# Patient Record
Sex: Male | Born: 1950 | Race: White | Hispanic: No | Marital: Married | Smoking: Never smoker
Health system: Southern US, Community
[De-identification: ages and names within clinical notes are randomized; demographics above are authoritative.]

## PROBLEM LIST (undated history)

## (undated) DIAGNOSIS — M1990 Unspecified osteoarthritis, unspecified site: Secondary | ICD-10-CM

## (undated) DIAGNOSIS — H409 Unspecified glaucoma: Secondary | ICD-10-CM

## (undated) DIAGNOSIS — H269 Unspecified cataract: Secondary | ICD-10-CM

## (undated) DIAGNOSIS — N189 Chronic kidney disease, unspecified: Secondary | ICD-10-CM

## (undated) HISTORY — PX: EYE SURGERY: SHX253

## (undated) HISTORY — DX: Unspecified osteoarthritis, unspecified site: M19.90

## (undated) HISTORY — PX: SPINE SURGERY: SHX786

## (undated) HISTORY — DX: Chronic kidney disease, unspecified: N18.9

## (undated) HISTORY — DX: Unspecified glaucoma: H40.9

## (undated) HISTORY — DX: Unspecified cataract: H26.9

---

## 2003-04-26 HISTORY — PX: CERVICAL DISC SURGERY: SHX588

## 2010-02-21 ENCOUNTER — Ambulatory Visit: Payer: Self-pay | Admitting: Family Medicine

## 2010-02-21 DIAGNOSIS — J189 Pneumonia, unspecified organism: Secondary | ICD-10-CM

## 2010-02-22 ENCOUNTER — Encounter: Payer: Self-pay | Admitting: Family Medicine

## 2010-02-23 ENCOUNTER — Telehealth (INDEPENDENT_AMBULATORY_CARE_PROVIDER_SITE_OTHER): Payer: Self-pay | Admitting: *Deleted

## 2010-03-04 ENCOUNTER — Ambulatory Visit: Payer: Self-pay | Admitting: Family Medicine

## 2010-03-04 DIAGNOSIS — R030 Elevated blood-pressure reading, without diagnosis of hypertension: Secondary | ICD-10-CM

## 2010-03-11 ENCOUNTER — Encounter: Payer: Self-pay | Admitting: Family Medicine

## 2010-03-12 ENCOUNTER — Encounter: Payer: Self-pay | Admitting: Family Medicine

## 2010-03-12 LAB — CONVERTED CEMR LAB
Albumin: 4.7 g/dL (ref 3.5–5.2)
Alkaline Phosphatase: 66 units/L (ref 39–117)
BUN: 20 mg/dL (ref 6–23)
Creatinine, Ser: 0.97 mg/dL (ref 0.40–1.50)
Glucose, Bld: 104 mg/dL — ABNORMAL HIGH (ref 70–99)
HDL: 42 mg/dL (ref >39)
Potassium: 5 meq/L (ref 3.5–5.3)
Total CHOL/HDL Ratio: 6.3
Triglycerides: 404 mg/dL — ABNORMAL HIGH (ref <150)

## 2010-05-25 NOTE — Progress Notes (Signed)
  Phone Note Outgoing Call Call back at [facility] Phone [phone]   Call placed by: Lajean Saver RN,  February 23, 2010 2:43 PM Call placed to: Patient Action Taken: Phone Call Completed Summary of Call: Callback: Patient reports he is improving slightly. I encouraged him to continue to take the antibiotic, a cough exptorant durign the day and a supressant at night.

## 2010-05-25 NOTE — Assessment & Plan Note (Signed)
Summary: Fever, Cough-yellowish, SOB, HA x 2 wks rm 5   Vital Signs:  Patient Profile:   60 Years Old Male CC:      Cold & URI symptoms Height:     70 inches Weight:      257 pounds O2 Sat:      96 % O2 treatment:    Room Air Temp:     100.9 degrees F oral Pulse rate:   76 / minute Pulse rhythm:   regular Resp:     14 per minute BP sitting:   142 / 76  (left arm) Cuff size:   regular  Vitals Entered By: Areta Haber CMA (February 21, 2010 4:30 PM)                  Current Allergies (reviewed today): ! TETRACYCLINE      History of Present Illness Chief Complaint: Cold & URI symptoms History of Present Illness:  Subjective: Patient complains of onset of a non-productive cough about 2 weeks ago without sore throat or sinus congestion.  Developed mild sore throat later.  Over the past 5 days he has developed recurring fever/sweats/chills and increasing fatigue.  Cough is worse at night.  He does not smoke.  He has had pneumonia in the past as a child   ? pleuritic pain No wheezing No nasal congestion No post-nasal drainage No sinus pain/pressure No itchy/red eyes No earache No hemoptysis + SOB No nausea No vomiting No abdominal pain No diarrhea No skin rashes +fatigue ? myalgias + headache Used OTC meds without relief   Current Problems: PNEUMONIA (ICD-486) FEVER UNSPECIFIED (ICD-780.60) COUGH (ICD-786.2) FAMILY HISTORY DIABETES 1ST DEGREE RELATIVE (ICD-V18.0)   Current Meds ADVIL 200 MG TABS (IBUPROFEN) as directed NYQUIL 60-7.09-21-998 MG/30ML LIQD (PSEUDOEPH-DOXYLAMINE-DM-APAP) As directed DAYQUIL MULTI-SYMPTOM 30-325-10 MG/15ML LIQD (PSEUDOEPHEDRINE-APAP-DM) as directed CLARITHROMYCIN 500 MG TABS (CLARITHROMYCIN) One Tab by mouth two times a day BENZONATATE 200 MG CAPS (BENZONATATE) One by mouth hs as needed cough  REVIEW OF SYSTEMS Constitutional Symptoms       Complains of fever and fatigue.     Denies chills, night sweats, weight loss,  and weight gain.  Eyes       Denies change in vision, eye pain, eye discharge, glasses, contact lenses, and eye surgery. Ear/Nose/Throat/Mouth       Denies hearing loss/aids, change in hearing, ear pain, ear discharge, dizziness, frequent runny nose, frequent nose bleeds, sinus problems, sore throat, hoarseness, and tooth pain or bleeding.      Comments: x 2 wks Respiratory       Complains of productive cough, wheezing, and shortness of breath.      Denies dry cough, asthma, bronchitis, and emphysema/COPD.  Cardiovascular       Denies murmurs, chest pain, and tires easily with exhertion.      Comments: chest congestion   Gastrointestinal       Denies stomach pain, nausea/vomiting, diarrhea, constipation, blood in bowel movements, and indigestion. Genitourniary       Denies painful urination, kidney stones, and loss of urinary control. Neurological       Complains of headaches.      Denies paralysis, seizures, and fainting/blackouts. Musculoskeletal       Denies muscle pain, joint pain, joint stiffness, decreased range of motion, redness, swelling, muscle weakness, and gout.  Skin       Denies bruising, unusual mles/lumps or sores, and hair/skin or nail changes.  Psych       Denies mood  changes, temper/anger issues, anxiety/stress, speech problems, depression, and sleep problems. Other Comments: Pt states he has been taking spouse's Tussionex and it has not worked.    Past History:  Past Medical History: Unremarkable  Past Surgical History: Disc Surgery  Family History: Family History Diabetes 1st degree relative Family History of Cardiovascular disorder Dad-Parkinson Disease  Social History: Married Never Smoked Alcohol use-no Drug use-no Regular exercise-no Does Patient Exercise:  no Smoking Status:  never Drug Use:  no   Objective:  No acute distress, alert and oriented  Eyes:  Pupils are equal, round, and reactive to light and accomdation.  Extraocular movement  is intact.  Conjunctivae are not inflamed.  Ears:  Canals normal.  Tympanic membranes normal.   Nose:  Normal septum.  Normal turbinates, mildly congested.    No sinus tenderness present. Pharynx:  Normal  Neck:  Supple.  No adenopathy is present.  No thyromegaly is present  Lungs:  Clear to auscultation.  Breath sounds are equal.  Heart:  Regular rate and rhythm without murmurs, rubs, or gallops.  Abdomen:  Nontender without masses or hepatosplenomegaly.  Bowel sounds are present.  No CVA or flank tenderness.  Extremities:  No edema.  Skin:  No rash CBC:  WBC 7.7; 10.3 MO, 63.7 GR, 26 LY; Hgb 13.7  CHEST - 2 VIEW   Comparison: None   Findings: The cardiomediastinal silhouette is unremarkable. Mild peribronchial thickening is noted. Patchy left lower lobe opacity may represent pneumonia. There is no evidence of pleural effusion or pneumothorax. No acute bony abnormalities are identified.   IMPRESSION: Question left lower lobe pneumonia.   Assessment New Problems: PNEUMONIA (ICD-486) FEVER UNSPECIFIED (ICD-780.60) COUGH (ICD-786.2) FAMILY HISTORY DIABETES 1ST DEGREE RELATIVE (ICD-V18.0)  PNEUMONIA; ? ATYPICAL  Plan New Medications/Changes: BENZONATATE 200 MG CAPS (BENZONATATE) One by mouth hs as needed cough  #12 x 0, 02/21/2010, Donna Christen MD CLARITHROMYCIN 500 MG TABS (CLARITHROMYCIN) One Tab by mouth two times a day  #20 x 0, 02/21/2010, Donna Christen MD  New Orders: T-Chest x-ray, 2 views [71020] CBC w/Diff [[id number]] New Patient Level IV [99204] Planning Comments:   Begin Biaxin for 10 days, expectorant daytime, cough suppressant at bedtime, increase fluid intake, rest. Follow-up with PCP in 10 days for repeat chest X-ray. Return for worsening symptoms   The patient and/or caregiver has been counseled thoroughly with regard to medications prescribed including dosage, schedule, interactions, rationale for use, and possible side effects and they verbalize  understanding.  Diagnoses and expected course of recovery discussed and will return if not improved as expected or if the condition worsens. Patient and/or caregiver verbalized understanding.  Prescriptions: BENZONATATE 200 MG CAPS (BENZONATATE) One by mouth hs as needed cough  #12 x 0   Entered and Authorized by:   Donna Christen MD   Signed by:   Donna Christen MD on 02/21/2010   Method used:   Print then Give to Patient   RxID:   [id number] CLARITHROMYCIN 500 MG TABS (CLARITHROMYCIN) One Tab by mouth two times a day  #20 x 0   Entered and Authorized by:   Donna Christen MD   Signed by:   Donna Christen MD on 02/21/2010   Method used:   Print then Give to Patient   RxID:   [id number]   Patient Instructions: 1)  Take Mucinex  (guaifenesin) twice daily for congestion. 2)  Increase fluid intake, rest. 3)  Stop other OTC cough medications. 4)  Follow-up with family doctor in about 10  days for repeat chest X-ray.  Orders Added: 1)  T-Chest x-ray, 2 views [71020] 2)  CBC w/Diff [[id number]] 3)  New Patient Level IV [52841]

## 2010-05-25 NOTE — Assessment & Plan Note (Signed)
Summary: NOV: F/U PNA   Vital Signs:  Patient profile:   60 year old male Height:      70 inches Weight:      248 pounds Pulse rate:   71 / minute BP sitting:   149 / 79  (right arm) Cuff size:   regular  Vitals Entered By: Avon Gully CMA, (AAMA) (March 04, 2010 1:15 PM) CC: NP est care-f/u pneumonia   CC:  NP est care-f/u pneumonia.  History of Present Illness: Dx with PNA on 02-21-10 by CXR. Given Biaxin. Completed ABX yesterday. 2 prior bouts of PNA as a child.  Still slight cough but overall feels tremendously better. Has not had a flu vaccine.   Habits & Providers  Alcohol-Tobacco-Diet     Alcohol drinks/day: <1  Exercise-Depression-Behavior     STD Risk: never     Drug Use: never     Seat Belt Use: always  Allergies: 1)  ! Tetracycline  Past History:  Past Medical History: Last updated: 02/21/2010 Unremarkable  Past Surgical History: Cervical Disc Surgery 2005  Family History: Family History Diabetes 1st degree relative Family History of Cardiovascular disorder Dad-Parkinson Disease, deceased.   Mother with CAD.    Social History: Retired Medical illustrator. 2 yrs of college. Married to [place] with 4 kids.  Married Never Smoked Alcohol use-no Drug use-no Regular exercise-no STD Risk:  never Drug Use:  never Risk analyst Use:  always  Review of Systems       No fever/sweats/weakness, unexplained weight loss/gain.  No vison changes.  No difficulty hearing/ringing in ears, hay fever/allergies.  No chest pain/discomfort, palpitations.  No Br lump/nipple discharge.  + cough/wheeze.  No blood in BM, nausea/vomiting/diarrhea.  No nighttime urination, leaking urine, unusual vaginal bleeding, discharge (penis or vagina).  No muscle/joint pain. No rash, change in mole.  No HA, memory loss.  No anxiety, sleep d/o, depression.  No easy bruising/bleeding, unexplained lump   Physical Exam  General:  Well-developed,well-nourished,in no acute distress;  alert,appropriate and cooperative throughout examination Head:  Normocephalic and atraumatic without obvious abnormalities. No apparent alopecia or balding. Lungs:  Normal respiratory effort, chest expands symmetrically. Lungs are clear to auscultation, no crackles or wheezes. Heart:  Normal rate and regular rhythm. S1 and S2 normal without gallop, murmur, click, rub or other extra sounds. Skin:  no rashes.   Cervical Nodes:  No lymphadenopathy noted Psych:  Cognition and judgment appear intact. Alert and cooperative with normal attention span and concentration. No apparent delusions, illusions, hallucinations   Impression & Recommendations:  Problem # 1:  PNEUMONIA (ICD-486) Exam is normla today. He is doing well.  Given flu vaccine.  The following medications were removed from the medication list:    Clarithromycin 500 Mg Tabs (Clarithromycin) ..... One tab by mouth two times a day  Problem # 2:  ELEVATED BP READING WITHOUT DX HYPERTENSION (ICD-796.2) He says he is working on his weight to improve this. has alsoready lost 7 lbs.  Will recheck at his f/u and if still elevated will need to tx. Rule out thyroid d/o.  Orders: T-TSH [phone])  Other Orders: T-Comprehensive Metabolic Panel [phone]) T-Lipid Profile [phone]) Flu Vaccine 70yrs + (52841) Admin 1st Vaccine (32440)   Patient Instructions: 1)  Follow up in the next month for a physical and so we can recheck your blood pressure.  2)  You can go to the lab anytime Mon-Fri. Make sure fast for 8 hours.    Orders Added: 1)  T-Comprehensive Metabolic  Panel [[id number]] 2)  T-Lipid Profile [[id number]] 3)  T-TSH [[id number]] 4)  New Patient Level III [99203] 5)  Flu Vaccine 42yrs + [90658] 6)  Admin 1st Vaccine [90471]   Immunizations Administered:  Influenza Vaccine # 1:    Vaccine Type: Fluvax 3+    Site: left deltoid    Mfr: GlaxoSmithKline    Dose: 0.5 ml    Route: IM    Given by: Sue Lush McCrimmon  CMA, (AAMA)    Exp. Date: 10/23/2010    Lot #: [id number]    VIS given: 11/17/09 version given March 04, 2010.  Flu Vaccine Consent Questions:    Do you have a history of severe allergic reactions to this vaccine? no    Any prior history of allergic reactions to egg and/or gelatin? no    Do you have a sensitivity to the preservative Thimersol? no    Do you have a past history of Guillan-Barre Syndrome? no    Do you currently have an acute febrile illness? no    Have you ever had a severe reaction to latex? no    Vaccine information given and explained to patient? no   Immunizations Administered:  Influenza Vaccine # 1:    Vaccine Type: Fluvax 3+    Site: left deltoid    Mfr: GlaxoSmithKline    Dose: 0.5 ml    Route: IM    Given by: Sue Lush McCrimmon CMA, (AAMA)    Exp. Date: 10/23/2010    Lot #: [id number]    VIS given: 11/17/09 version given March 04, 2010.  Flex Sig Next Due:  Not Indicated Colonoscopy Result Date:  09/23/2008 Colonoscopy Result:  normal Hemoccult Next Due:  Not Indicated

## 2010-12-31 ENCOUNTER — Telehealth: Payer: Self-pay | Admitting: *Deleted

## 2011-02-07 ENCOUNTER — Telehealth: Payer: Self-pay | Admitting: Family Medicine

## 2011-02-07 NOTE — Telephone Encounter (Signed)
Patient called left voicemail request a call back about prescription refill and that he needs a letter in order for him to get a refill. Patient request a call back

## 2011-02-09 NOTE — Telephone Encounter (Signed)
The vm is for Arlyn Leak the patient's mother

## 2011-02-24 ENCOUNTER — Encounter: Payer: Self-pay | Admitting: Family Medicine

## 2011-03-02 ENCOUNTER — Ambulatory Visit (INDEPENDENT_AMBULATORY_CARE_PROVIDER_SITE_OTHER): Payer: 59 | Admitting: Family Medicine

## 2011-03-02 ENCOUNTER — Encounter: Payer: Self-pay | Admitting: Family Medicine

## 2011-03-02 VITALS — BP 133/81 | HR 72 | Ht 70.0 in | Wt 251.0 lb

## 2011-03-02 DIAGNOSIS — N400 Enlarged prostate without lower urinary tract symptoms: Secondary | ICD-10-CM | POA: Insufficient documentation

## 2011-03-02 DIAGNOSIS — Z0000 Encounter for general adult medical examination without abnormal findings: Secondary | ICD-10-CM

## 2011-03-02 DIAGNOSIS — Z23 Encounter for immunization: Secondary | ICD-10-CM

## 2011-03-02 NOTE — Patient Instructions (Signed)
Start a regular exercise program and make sure you are eating a healthy diet Try to eat 4 servings of dairy a day or take a calcium supplement (500mg  twice a day). We will call you with your lab results. If you don't here from [place] in about a week then please give [place] a call at [phone].

## 2011-03-02 NOTE — Progress Notes (Signed)
Subjective:    Patient ID: Steven Ashley, male    DOB: 08-18-1950, 60 y.o.   MRN: [id number]  HPI Here for CPE. No complaints. No regular exercise. Urinating 1-2 at night.  Dec force of stream. He says he usually takes saw palmetto and this seems to help. He also notes that when he eats and drinks more coffee and avoids tea and coffee products that his urinary symptoms actually improved. He also notices that when his weight is under better control it his urinary symptoms improved as well. His father has a history of BPH that required surgery. No history or family history of prostate cancer.   Review of Systems     BP 133/81  Pulse 72  Wt 251 lb (113.853 kg)    Allergies  Allergen Reactions  . Tetracycline     REACTION: Rash    History reviewed. No pertinent past medical history.  Past Surgical History  Procedure Date  . Cervical disc surgery 2005    History   Social History  . Marital Status: Married    Spouse Name: N/A    Number of Children: 4  . Years of Education: N/A   Occupational History  . Not on file.   Social History Main Topics  . Smoking status: Never Smoker   . Smokeless tobacco: Not on file  . Alcohol Use: Yes  . Drug Use: No  . 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   Other Topics Concern  . Not on file   Social History Narrative   Some regular exercise.      Family History  Problem Relation Age of Onset  . Diabetes    . Heart disease Mother   . Parkinsonism Father     deceased  . Coronary artery disease Mother     smoker  . Dementia Mother   . Diabetic kidney disease Mother     No current outpatient prescriptions on file.    Objective:   Physical Exam  Constitutional: He is oriented to person, place, and time. He appears well-developed and well-nourished.  HENT:  Head: Normocephalic and atraumatic.  Right Ear: External ear normal.  Left Ear: External ear normal.  Nose: Nose normal.   Mouth/Throat: Oropharynx is clear and moist.  Eyes: Conjunctivae and EOM are normal. Pupils are equal, round, and reactive to light.  Neck: Normal range of motion. Neck supple. No thyromegaly present.  Cardiovascular: Normal rate, regular rhythm, normal heart sounds and intact distal pulses.        No carotid or abdominal bruits.  Pulmonary/Chest: Effort normal and breath sounds normal.  Abdominal: Soft. Bowel sounds are normal. He exhibits no distension and no mass. There is no tenderness. There is no rebound and no guarding.  Genitourinary: Rectum normal.       Prostate was 2+ hypertrophy. No nodules or bogginess.  Musculoskeletal: Normal range of motion.  Lymphadenopathy:    He has no cervical adenopathy.  Neurological: He is alert and oriented to person, place, and time. He has normal reflexes.  Skin: Skin is warm and dry.  Psychiatric: He has a normal mood and affect. His behavior is normal. Judgment and thought content normal.          Assessment & Plan:  CPE- overall he is doing well. We did discuss the importance of weight loss. He says he plans to work on exercise and diet. His blood pressure is  under control today. I gave him a lab slip for screening lab work. We will call him with results. We'll also check a PSA.   He was to check with his insurance first to see if they cover the shingles vaccine before getting it. He did get his flu shot today.  BPH-his AUA score is 8 today, which is moderate. We discussed that we could consider starting a medication to help with his BPH or if the saw palmetto seems to work in addition to dietary changes and losing weight then we could opt to that as well. He says he would like to work on his weight etc. hold off on any medications. I did let him know that it seems to get worse to feel free to call the office to schedule an appointment.

## 2011-03-09 LAB — COMPLETE METABOLIC PANEL WITH GFR
AST: 24 U/L (ref 0–37)
Alkaline Phosphatase: 55 U/L (ref 39–117)
BUN: 20 mg/dL (ref 6–23)
Creat: 0.96 mg/dL (ref 0.50–1.35)
Potassium: 4.7 mEq/L (ref 3.5–5.3)

## 2011-03-09 LAB — LIPID PANEL
Cholesterol: 308 mg/dL — ABNORMAL HIGH (ref 0–200)
LDL Cholesterol: 186 mg/dL — ABNORMAL HIGH (ref 0–99)
Total CHOL/HDL Ratio: 7.2 Ratio
Triglycerides: 394 mg/dL — ABNORMAL HIGH (ref <150)
VLDL: 79 mg/dL — ABNORMAL HIGH (ref 0–40)

## 2011-03-09 LAB — PSA: PSA: 0.8 ng/mL (ref <=4.00)

## 2011-03-10 ENCOUNTER — Other Ambulatory Visit: Payer: Self-pay | Admitting: Family Medicine

## 2011-03-10 MED ORDER — SIMVASTATIN 40 MG PO TABS: 40.0000 mg | ORAL_TABLET | Freq: Every day | ORAL | Status: DC

## 2011-03-21 ENCOUNTER — Encounter: Payer: Self-pay | Admitting: *Deleted

## 2011-03-21 ENCOUNTER — Emergency Department (INDEPENDENT_AMBULATORY_CARE_PROVIDER_SITE_OTHER)
Admission: EM | Admit: 2011-03-21 | Discharge: 2011-03-21 | Disposition: A | Discharge status: Home / Self Care | Payer: 59 | Source: Home / Self Care | Attending: Emergency Medicine | Admitting: Emergency Medicine

## 2011-03-21 DIAGNOSIS — S51809A Unspecified open wound of unspecified forearm, initial encounter: Secondary | ICD-10-CM

## 2011-03-21 DIAGNOSIS — S40259A Superficial foreign body of unspecified shoulder, initial encounter: Secondary | ICD-10-CM

## 2011-03-21 MED ORDER — CEPHALEXIN 500 MG PO CAPS: 500.0000 mg | ORAL_CAPSULE | Freq: Two times a day (BID) | ORAL | Status: AC

## 2011-03-21 NOTE — ED Notes (Addendum)
Patient fell through a barn roof today. He had a piece of wood that splintered his upper right arm. He was able to pull our most of the wood but still has a few pieces left. He has a shallow laceration about 3 inches long. Bleeding has stopped. Last TDaP he beleives was 3 years ago. No other injures noted.

## 2011-03-21 NOTE — ED Provider Notes (Signed)
History     CSN: [id number] Arrival date & time: 03/21/2011  1:06 PM   First MD Initiated Contact with Patient 03/21/11 1345      Chief Complaint  Patient presents with  . 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  When he got up he noticed that he had cut his right arm. He pulled a small splinter out of his arm but he thinks that there are residual pieces still left in there. There was a little bit of bleeding but that stopped with pressure. He also has slight tenderness as well. He states that he got a recent tetanus shot about 3 years ago.  History reviewed. No pertinent past medical history.  Past Surgical History  Procedure Date  . Cervical disc surgery 2005    Family History  Problem Relation Age of Onset  . Diabetes    . Heart disease Mother   . Parkinsonism Father     deceased  . Coronary artery disease Mother     smoker  . Dementia Mother   . Diabetic kidney disease Mother     History  Substance Use Topics  . Smoking status: Never Smoker   . Smokeless tobacco: Not on file  . Alcohol Use: Yes      Review of Systems  Allergies  Tetracycline  Home Medications   Current Outpatient Rx  Name Route Sig Dispense Refill  . SIMVASTATIN 40 MG PO TABS Oral Take 1 tablet (40 mg total) by mouth at bedtime. 90 tablet 0    BP 127/81  Pulse 83  Temp(Src) 98.8 F (37.1 C) (Oral)  Resp 14  Ht 5\' 10"  (1.778 m)  Wt 253 lb (114.76 kg)  BMI 36.30 kg/m2  SpO2 96%  Physical Exam  Nursing note and vitals reviewed. Constitutional: He is oriented to person, place, and time. He appears well-developed and well-nourished.  HENT:  Head: Normocephalic and atraumatic.  Neck: Neck supple.  Cardiovascular: Regular rhythm and normal heart sounds.   Pulmonary/Chest: Effort normal and  breath sounds normal. No respiratory distress.  Neurological: He is alert and oriented to person, place, and time.  Skin: Skin is warm and dry.       Right upper arm: There is a 6 cm very superficial scratch on the arm and has minimal bleeding. The bleeding to stop pressure. There are scattered dark specks throughout the scratch. There is no signs of current infection. Minimal tenderness. Distal neurovascular status is intact.   Psychiatric: He has a normal mood and affect. His speech is normal.    ED Course  Procedures (including critical care time)  Labs Reviewed - No data to display No results found.   No diagnosis found.    MDM   The area was cleaned with Hibiclens soaked with LET solution.  We then explored the wound and removed multiple very small splinters that were only a few millimeters long we do not need to incise the wound at all or use any injections. The patient tolerated this procedure very well. We then covered with antibiotic ointment, and gave him a prescription for Keflex to prevent any infection, and he will take care of this at home. I feel this is probably most appropriate if he does some very light scrubbing to remove any kind of residual pieces that are still left  over. However I do not feel that he will be having an infection or any type residual symptoms for this. If she does develop any type of further infection, I would like him to followup either here or with his primary care doctor so that we can look at the wound. If there is further problems, it may be reasonable you to get an x-ray or an ultrasound to see if there is any residual fragments still left in place. If that is the case that he may have had a general surgeon to look at that cut into a scan and see if they can retrieve it that way.    Lily Kocher, MD 03/21/11 [phone]

## 2012-07-26 ENCOUNTER — Ambulatory Visit (INDEPENDENT_AMBULATORY_CARE_PROVIDER_SITE_OTHER): Payer: 59 | Admitting: Family Medicine

## 2012-07-26 ENCOUNTER — Encounter: Payer: Self-pay | Admitting: Family Medicine

## 2012-07-26 ENCOUNTER — Other Ambulatory Visit: Payer: Self-pay | Admitting: *Deleted

## 2012-07-26 VITALS — BP 140/74 | HR 70 | Ht 69.0 in | Wt 244.0 lb

## 2012-07-26 DIAGNOSIS — J309 Allergic rhinitis, unspecified: Secondary | ICD-10-CM

## 2012-07-26 DIAGNOSIS — R040 Epistaxis: Secondary | ICD-10-CM

## 2012-07-26 MED ORDER — MUPIROCIN 2 % EX OINT: TOPICAL_OINTMENT | Freq: Two times a day (BID) | CUTANEOUS | Status: DC

## 2012-07-26 NOTE — Patient Instructions (Addendum)
Claritin Daily.   Ointment apply twice a day Run humidifier at bedtime.   Call if not better in  One week.

## 2012-07-26 NOTE — Progress Notes (Signed)
  Subjective:    Patient ID: Steven Ashley, male    DOB: 1950-05-12, 62 y.o.   MRN: [id number]  HPI Did some mowing 2 days ago and then started getting a scratchy throat and then thought maybe a cold. Then had a severe HA around noon yesterday. Pain across the nose and sinuses.  + post nasal drip.  No fever. No cough.  Says couldn't lay flat bc of HA. No GI sxs.  Throat is about hte seem. Has been having nose bleeds recently.     Review of Systems     Objective:   Physical Exam  Constitutional: He is oriented to person, place, and time. He appears well-developed and well-nourished.  HENT:  Head: Normocephalic and atraumatic.  Right Ear: External ear normal.  Left Ear: External ear normal.  Nose: Nose normal.  Mouth/Throat: Oropharynx is clear and moist.  TMs and canals are clear.   Eyes: Conjunctivae and EOM are normal. Pupils are equal, round, and reactive to light.  Neck: Neck supple. No thyromegaly present.  Cardiovascular: Normal rate and normal heart sounds.   Pulmonary/Chest: Effort normal and breath sounds normal.  Musculoskeletal: He exhibits no edema.  Lymphadenopathy:    He has no cervical adenopathy.  Neurological: He is alert and oriented to person, place, and time.  Skin: Skin is warm and dry.  Psychiatric: He has a normal mood and affect.          Assessment & Plan:  Allergic rhinitis - will tx w/ claritin. Call if not getting better or if suddenly gets worse.  Can use Tylenol for HA relief.  Possible early sinus infection/viral.   Nosebleeds - treat with bactroban oint BID and recommend running a humidifier at bedtime. Avoid blowing the nose frequently. Avoid picking at the nose. Avoid rubbing the nose aggressively.

## 2012-07-30 ENCOUNTER — Ambulatory Visit (INDEPENDENT_AMBULATORY_CARE_PROVIDER_SITE_OTHER): Payer: 59 | Admitting: Family Medicine

## 2012-07-30 ENCOUNTER — Other Ambulatory Visit: Payer: Self-pay | Admitting: Family Medicine

## 2012-07-30 ENCOUNTER — Encounter: Payer: Self-pay | Admitting: Family Medicine

## 2012-07-30 VITALS — BP 121/76 | HR 62 | Wt 248.0 lb

## 2012-07-30 DIAGNOSIS — N529 Male erectile dysfunction, unspecified: Secondary | ICD-10-CM | POA: Insufficient documentation

## 2012-07-30 DIAGNOSIS — N400 Enlarged prostate without lower urinary tract symptoms: Secondary | ICD-10-CM

## 2012-07-30 DIAGNOSIS — R5381 Other malaise: Secondary | ICD-10-CM

## 2012-07-30 DIAGNOSIS — R5383 Other fatigue: Secondary | ICD-10-CM

## 2012-07-30 DIAGNOSIS — R040 Epistaxis: Secondary | ICD-10-CM

## 2012-07-30 LAB — CBC WITH DIFFERENTIAL/PLATELET
Eosinophils Absolute: 0.3 10*3/uL (ref 0.0–0.7)
Eosinophils Relative: 4 % (ref 0–5)
Hemoglobin: 14.6 g/dL (ref 13.0–17.0)
Lymphs Abs: 2 10*3/uL (ref 0.7–4.0)
MCH: 28.9 pg (ref 26.0–34.0)
MCHC: 33.4 g/dL (ref 30.0–36.0)
MCV: 86.4 fL (ref 78.0–100.0)
Monocytes Relative: 8 % (ref 3–12)
RBC: 5.06 MIL/uL (ref 4.22–5.81)

## 2012-07-30 MED ORDER — TADALAFIL 5 MG PO TABS: 5.0000 mg | ORAL_TABLET | Freq: Every day | ORAL | Status: DC | PRN

## 2012-07-30 MED ORDER — SILDENAFIL CITRATE 100 MG PO TABS: 100.0000 mg | ORAL_TABLET | Freq: Every day | ORAL | Status: DC | PRN

## 2012-07-30 NOTE — Progress Notes (Signed)
  Subjective:    Patient ID: Steven Ashley, male    DOB: 15-Feb-1951, 62 y.o.   MRN: [id number]  HPI Nosebleeds - They are much better overall.Using the bactroban ointments.   AR - Says sinuses are getting better. Still has green nasal congestion.  No fever.  Lost voice yesterday but acutally feels better today.   He started his allergy medication.   ED- Started about 4-6 months ago. Retired in November and started around that time.  Not on any medications.  He is on a diet to see if weight loss. No libido issues. He has difficulty with erection and penetration at least 50% of the time. He does report feeling more fatigued. No new medications or supplements. He does take saw palmetto for his prostate. He also takes a [employer] multivitamin. Father had prostate surgery in his mid-50s to 29s.  Has felt more tired.  Sleeps well. Has never tried any ED medications. He denies any new sexual partners or penile discharge.   Review of Systems     Objective:   Physical Exam  Constitutional: He is oriented to person, place, and time. He appears well-developed and well-nourished.  HENT:  Head: Normocephalic and atraumatic.  Cardiovascular: Normal rate, regular rhythm and normal heart sounds.   Pulmonary/Chest: Effort normal and breath sounds normal.  Neurological: He is alert and oriented to person, place, and time.  Skin: Skin is warm and dry.  Psychiatric: He has a normal mood and affect. His behavior is normal.          Assessment & Plan:  Nosebleeds - REsolving with Bactroban ointment. Also encouraged using a humidifier..    AR - Getting some better with treatment for allergies, call if suddenly worse and we can call in abx for sinusitis.   ED - Explained need to work on weight loss, exercise and healthy diet. I explained how weight loss can reduce estrogen and actually promote testosterone levels. Will check testosterone level since she did screen positive on the questionnaire. He answered yes  to 6/10 questions. His ED score was 13 which is significant. We also discussed the use of as needed erectile dysfunction medications such as Viagra and Cialis and Levitra. Coupon card given for 3 free tablets of Viagra. Prescription written for 100 mg tabs. Encouraged him to cut them in half and try half a tab first. He does have a history of BPH so we also discussed the option of using low-dose Cialis daily to help control his BPH and help with erectile dysfunction. Given 30 day free coupon card for 5 mg dose. He is not to take the Viagra and Cialis at the same time and he is aware of this. Will check CBC, TSH, and testosterone levels  BPH-his AUA score was 15 today, moderate.  Time spent 25 minutes, greater than 50% of time spent counseling about erectile dysfunction and treatment options.

## 2012-07-31 LAB — HEPATIC FUNCTION PANEL
ALT: 52 U/L (ref 0–53)
AST: 64 U/L — ABNORMAL HIGH (ref 0–37)
Albumin: 4.5 g/dL (ref 3.5–5.2)
Alkaline Phosphatase: 60 U/L (ref 39–117)
Total Protein: 6.9 g/dL (ref 6.0–8.3)

## 2012-07-31 LAB — HEPATITIS PANEL, ACUTE: Hepatitis B Surface Ag: NEGATIVE

## 2012-07-31 LAB — TESTOSTERONE, FREE, TOTAL, SHBG
Sex Hormone Binding: 27 nmol/L (ref 13–71)
Testosterone, Free: 41.6 pg/mL — ABNORMAL LOW (ref 47.0–244.0)
Testosterone-% Free: 2.1 % (ref 1.6–2.9)
Testosterone: 196 ng/dL — ABNORMAL LOW (ref 300–890)

## 2012-07-31 NOTE — Addendum Note (Signed)
Addended by: Deno Etienne on: 07/31/2012 11:54 AM   Modules accepted: Orders

## 2012-08-01 ENCOUNTER — Other Ambulatory Visit: Payer: Self-pay | Admitting: Family Medicine

## 2012-08-01 DIAGNOSIS — R7989 Other specified abnormal findings of blood chemistry: Secondary | ICD-10-CM

## 2012-08-01 DIAGNOSIS — R748 Abnormal levels of other serum enzymes: Secondary | ICD-10-CM

## 2012-08-06 ENCOUNTER — Ambulatory Visit (INDEPENDENT_AMBULATORY_CARE_PROVIDER_SITE_OTHER): Payer: 59 | Admitting: Physician Assistant

## 2012-08-06 ENCOUNTER — Encounter: Payer: Self-pay | Admitting: Physician Assistant

## 2012-08-06 VITALS — BP 133/79 | HR 75 | Wt 241.0 lb

## 2012-08-06 DIAGNOSIS — W57XXXA Bitten or stung by nonvenomous insect and other nonvenomous arthropods, initial encounter: Secondary | ICD-10-CM

## 2012-08-06 DIAGNOSIS — L03319 Cellulitis of trunk, unspecified: Secondary | ICD-10-CM

## 2012-08-06 DIAGNOSIS — L0390 Cellulitis, unspecified: Secondary | ICD-10-CM

## 2012-08-06 DIAGNOSIS — S30860A Insect bite (nonvenomous) of lower back and pelvis, initial encounter: Secondary | ICD-10-CM

## 2012-08-06 MED ORDER — AMOXICILLIN-POT CLAVULANATE 500-125 MG PO TABS: 1.0000 | ORAL_TABLET | Freq: Three times a day (TID) | ORAL | Status: DC

## 2012-08-06 NOTE — Progress Notes (Signed)
  Subjective:    Patient ID: Steven Ashley, male    DOB: 22-Jun-1950, 62 y.o.   MRN: [id number]  HPI Patient presents to the clinic with a tick bite from 8 days ago on her back. 5 days after tick bite he noticed a red rash around bite and increasing in size to about a dime. It continues to get bigger and redder. His wife wanted him to go to the doctor. Denies any fever, chills, muscle aches, pains, sweats.  brougth the tick in today.     Review of Systems     Objective:   Physical Exam  Constitutional: He appears well-developed and well-nourished.  Cardiovascular: Normal rate, regular rhythm and normal heart sounds.   Pulmonary/Chest: Effort normal and breath sounds normal.  Skin:             Assessment & Plan:  Tick bite- Tick appears to be a lone star tick(brown with a white spot on back) they do not tend to carry lymes disease. Rash is also not presenting in typical lymes fashion. Due to surround cellulitis and potential for lymes will treat. Pt has an allergy to tetracyclines so I did not choose doxycycline but sent augmentin. Call if not improving or if any systemic symptoms. Will check for antibodies to lymes.

## 2012-10-29 ENCOUNTER — Telehealth: Payer: Self-pay | Admitting: *Deleted

## 2012-10-29 NOTE — Telephone Encounter (Signed)
onlythe Cialis is FDA approved to help BPH. He has to be on it for at least a week to know if it's helping with the erectile dysfunction part. Because it is low dose it works a little bit differently. For his erectile dysfunction if he would prefer to use Viagra and did not worry about the enlarged prostate at this time then we can use the Viagra. We can always use a different prescription medication for the enlarged prostate with the Viagra for erectile dysfunction.

## 2012-10-29 NOTE — Telephone Encounter (Signed)
Pt calls and states you gave him samples of Cialis and Viagra and he tried the Cialis and it really didn't work for him.  Taking the Viagra 1/3 tablet helps him the most and sometimes doesn't have to take another one for like 3 days.  I told him it looked like you told him the Cialis would help with the BPH that he has a history of and that I would ask you which one you prefer and he would do what you said but needs a refill on which ever one you decide. Barry Dienes, LPN

## 2012-10-29 NOTE — Telephone Encounter (Signed)
Pt notified of MD instructions and he wants to check with his insurance on cost of each drug and do research on life style changes for BPH and he will let us know at that time. Barry Dienes, LPN

## 2012-10-30 MED ORDER — TADALAFIL 5 MG PO TABS: 5.0000 mg | ORAL_TABLET | Freq: Every day | ORAL | Status: DC

## 2012-10-30 NOTE — Telephone Encounter (Signed)
Refill sent.

## 2012-10-30 NOTE — Telephone Encounter (Signed)
Pt called & states that he would like to go through with cialis.

## 2012-11-03 ENCOUNTER — Other Ambulatory Visit: Payer: Self-pay | Admitting: Family Medicine

## 2012-11-08 ENCOUNTER — Other Ambulatory Visit: Payer: Self-pay | Admitting: Family Medicine

## 2013-08-23 ENCOUNTER — Other Ambulatory Visit: Payer: Self-pay | Admitting: Orthopedic Surgery

## 2013-08-23 DIAGNOSIS — M1990 Unspecified osteoarthritis, unspecified site: Secondary | ICD-10-CM

## 2013-08-28 ENCOUNTER — Ambulatory Visit
Admission: RE | Admit: 2013-08-28 | Discharge: 2013-08-28 | Disposition: A | Discharge status: Home / Self Care | Payer: 59 | Source: Ambulatory Visit | Attending: Orthopedic Surgery | Admitting: Orthopedic Surgery

## 2013-08-28 ENCOUNTER — Inpatient Hospital Stay: Admission: RE | Admit: 2013-08-28 | Payer: 59 | Source: Ambulatory Visit

## 2013-08-28 DIAGNOSIS — M1990 Unspecified osteoarthritis, unspecified site: Secondary | ICD-10-CM

## 2015-01-29 ENCOUNTER — Encounter: Payer: Self-pay | Admitting: Family Medicine

## 2015-01-29 ENCOUNTER — Ambulatory Visit (INDEPENDENT_AMBULATORY_CARE_PROVIDER_SITE_OTHER): Payer: 59

## 2015-01-29 ENCOUNTER — Ambulatory Visit (INDEPENDENT_AMBULATORY_CARE_PROVIDER_SITE_OTHER): Payer: 59 | Admitting: Family Medicine

## 2015-01-29 VITALS — BP 138/78 | HR 83 | Wt 237.0 lb

## 2015-01-29 DIAGNOSIS — S92302A Fracture of unspecified metatarsal bone(s), left foot, initial encounter for closed fracture: Secondary | ICD-10-CM | POA: Diagnosis not present

## 2015-01-29 DIAGNOSIS — X58XXXD Exposure to other specified factors, subsequent encounter: Secondary | ICD-10-CM | POA: Diagnosis not present

## 2015-01-29 DIAGNOSIS — S99912A Unspecified injury of left ankle, initial encounter: Secondary | ICD-10-CM | POA: Diagnosis not present

## 2015-01-29 DIAGNOSIS — S92352A Displaced fracture of fifth metatarsal bone, left foot, initial encounter for closed fracture: Secondary | ICD-10-CM | POA: Diagnosis not present

## 2015-01-29 DIAGNOSIS — S92353A Displaced fracture of fifth metatarsal bone, unspecified foot, initial encounter for closed fracture: Secondary | ICD-10-CM | POA: Insufficient documentation

## 2015-01-29 NOTE — Progress Notes (Signed)
Steven Ashley is a 64 y.o. male who presents to [facility]  today for left ankle fracture. Patient suffered an inversion injury to his left ankle and foot and now notes pain in the lateral ankle and foot. He denies any radiating pain weakness or numbness fevers or chills. No treatments tried yet. He is using crutches which seems to help some.   No past medical history on file. Past Surgical History  Procedure Laterality Date  . Cervical disc surgery  2005   Social History  Substance Use Topics  . Smoking status: Never Smoker   . Smokeless tobacco: Not on file  . Alcohol Use: Yes   family history includes Benign prostatic hyperplasia in his father; Coronary artery disease in his mother; Dementia in his mother; Diabetes in an other family member; Diabetic kidney disease in his mother; Heart disease in his mother; Parkinsonism in his father.  ROS as above Medications: Current Outpatient Prescriptions  Medication Sig Dispense Refill  . Multiple Vitamin (MULTIVITAMIN WITH MINERALS) TABS Take 1 tablet by mouth daily. [place]    . saw palmetto 160 MG capsule Take 160 mg by mouth daily.    . sildenafil (VIAGRA) 100 MG tablet Take 1 tablet (100 mg total) by mouth daily as needed for erectile dysfunction. 3 tablet 0   No current facility-administered medications for this visit.   Allergies  Allergen Reactions  . Tetracycline     REACTION: Rash     Exam:  BP 138/78 mmHg  Pulse 83  Wt 237 lb (107.502 kg) Gen: Well NAD HEENT: EOMI,  MMM Lungs: Normal work of breathing. CTABL Heart: RRR no MRG Abd: NABS, Soft. Nondistended, Nontender Exts: Brisk capillary refill, warm and well perfused.  Left foot swollen and tender fifth metatarsal. Ankle is mildly tender without significant swelling. Motion not tested. Pulses capillary refill sensation intact.  Preliminary x-ray of the foot and foot shows a avulsion type fracture of the fifth metatarsal that  appears to be comminuted. Additionally there is a secondary fracture of another structure that's difficult to ascertain. Suspect calcaneus involvement. Awaiting formal radiology  No results found for this or any previous visit (from the past 24 hour(s)). No results found.    64 year old man with left fifth metatarsal fracture. Posterior leg splint fitted. Patient will return in 1 week for recheck and reevaluation. NSAIDs for pain control.

## 2015-01-29 NOTE — Patient Instructions (Signed)
Thank you for coming in today. Return in 1 week for recheck and cast.   Metatarsal Fracture A metatarsal fracture is a break in a metatarsal bone. Metatarsal bones connect your toe bones to your ankle bones. CAUSES This type of fracture may be caused by:  A sudden twisting of your foot.  A fall onto your foot.  Overuse or repetitive exercise. RISK FACTORS This condition is more likely to develop in people who:  Play contact sports.  Have a bone disease.  Have a low calcium level. SYMPTOMS Symptoms of this condition include:  Pain that is worse when walking or standing.  Pain when pressing on the foot or moving the toes.  Swelling.  Bruising on the top or bottom of the foot.  A foot that appears shorter than the other one. DIAGNOSIS This condition is diagnosed with a physical exam. You may also have imaging tests, such as:  X-rays.  A CT scan.  MRI. TREATMENT Treatment for this condition depends on its severity and whether a bone has moved out of place. Treatment may involve:  Rest.  Wearing foot support such as a cast, splint, or boot for several weeks.  Using crutches.  Surgery to move bones back into the right position. Surgery is usually needed if there are many pieces of broken bone or bones that are very out of place (displaced fracture).  Physical therapy. This may be needed to help you regain full movement and strength in your foot. You will need to return to your health care provider to have X-rays taken until your bones heal. Your health care provider will look at the X-rays to make sure that your foot is healing well. HOME CARE INSTRUCTIONS  If You Have a Cast:  Do not stick anything inside the cast to scratch your skin. Doing that increases your risk of infection.  Check the skin around the cast every day. Report any concerns to your health care provider. You may put lotion on dry skin around the edges of the cast. Do not apply lotion to the skin  underneath the cast.  Keep the cast clean and dry. If You Have a Splint or a Supportive Boot:  Wear it as directed by your health care provider. Remove it only as directed by your health care provider.  Loosen it if your toes become numb and tingle, or if they turn cold and blue.  Keep it clean and dry. Bathing  Do not take baths, swim, or use a hot tub until your health care provider approves. Ask your health care provider if you can take showers. You may only be allowed to take sponge baths for bathing.  If your health care provider approves bathing and showering, cover the cast or splint with a watertight plastic bag to protect it from water. Do not let the cast or splint get wet. Managing Pain, Stiffness, and Swelling  If directed, apply ice to the injured area (if you have a splint, not a cast).  Put ice in a plastic bag.  Place a towel between your skin and the bag.  Leave the ice on for 20 minutes, 2-3 times per day.  Move your toes often to avoid stiffness and to lessen swelling.  Raise (elevate) the injured area above the level of your heart while you are sitting or lying down. Driving  Do not drive or operate heavy machinery while taking pain medicine.  Do not drive while wearing foot support on a foot that you  use for driving. Activity  Return to your normal activities as directed by your health care provider. Ask your health care provider what activities are safe for you.  Perform exercises as directed by your health care provider or physical therapist. Safety  Do not use the injured foot to support your body weight until your health care provider says that you can. Use crutches as directed by your health care provider. General Instructions  Do not put pressure on any part of the cast or splint until it is fully hardened. This may take several hours.  Do not use any tobacco products, including cigarettes, chewing tobacco, or e-cigarettes. Tobacco can delay  bone healing. If you need help quitting, ask your health care provider.  Take medicines only as directed by your health care provider.  Keep all follow-up visits as directed by your health care provider. This is important. SEEK MEDICAL CARE IF:  You have a fever.  Your cast, splint, or boot is too loose or too tight.  Your cast, splint, or boot is damaged.  Your pain medicine is not helping.  You have pain, tingling, or numbness in your foot that is not going away. SEEK IMMEDIATE MEDICAL CARE IF:  You have severe pain.  You have tingling or numbness in your foot that is getting worse.  Your foot feels cold or becomes numb.  Your foot changes color.   This information is not intended to replace advice given to you by your health care provider. Make sure you discuss any questions you have with your health care provider.   Document Released: 01/01/2002 Document Revised: 08/26/2014 Document Reviewed: 02/05/2014 Elsevier Interactive Patient Education [employer].

## 2015-01-30 NOTE — Progress Notes (Signed)
Quick Note:  Xray shows what we talked about. We will get a CT scan to further evaluate the exact second fracture. ______

## 2015-02-03 ENCOUNTER — Telehealth: Payer: Self-pay | Admitting: *Deleted

## 2015-02-03 NOTE — Telephone Encounter (Signed)
Pt stated that he thought that Dr. Georgina Snell was going to consult with an orthopedic surgeon with regards to the broken bone. Advised pt that someone will call him with regards to getting the CT done.Audelia Hives [place]

## 2015-02-04 ENCOUNTER — Ambulatory Visit (INDEPENDENT_AMBULATORY_CARE_PROVIDER_SITE_OTHER): Payer: 59

## 2015-02-04 ENCOUNTER — Other Ambulatory Visit: Payer: Self-pay | Admitting: Family Medicine

## 2015-02-04 DIAGNOSIS — S92002A Unspecified fracture of left calcaneus, initial encounter for closed fracture: Secondary | ICD-10-CM | POA: Diagnosis not present

## 2015-02-04 DIAGNOSIS — S92302A Fracture of unspecified metatarsal bone(s), left foot, initial encounter for closed fracture: Secondary | ICD-10-CM

## 2015-02-04 DIAGNOSIS — X58XXXA Exposure to other specified factors, initial encounter: Secondary | ICD-10-CM

## 2015-02-04 DIAGNOSIS — S99912A Unspecified injury of left ankle, initial encounter: Secondary | ICD-10-CM

## 2015-02-04 DIAGNOSIS — S92352A Displaced fracture of fifth metatarsal bone, left foot, initial encounter for closed fracture: Secondary | ICD-10-CM

## 2015-02-04 DIAGNOSIS — S92022A Displaced fracture of anterior process of left calcaneus, initial encounter for closed fracture: Secondary | ICD-10-CM

## 2015-02-04 NOTE — Progress Notes (Signed)
Quick Note:  CT shows multiple broken bones in the foot. It should heal well. Return on Friday as scheduled. We will look at the pictures then. ______

## 2015-02-06 ENCOUNTER — Encounter: Payer: Self-pay | Admitting: Family Medicine

## 2015-02-06 ENCOUNTER — Ambulatory Visit (INDEPENDENT_AMBULATORY_CARE_PROVIDER_SITE_OTHER): Payer: 59

## 2015-02-06 ENCOUNTER — Ambulatory Visit (INDEPENDENT_AMBULATORY_CARE_PROVIDER_SITE_OTHER): Payer: 59 | Admitting: Family Medicine

## 2015-02-06 VITALS — BP 112/78 | HR 68 | Wt 244.0 lb

## 2015-02-06 DIAGNOSIS — S92002A Unspecified fracture of left calcaneus, initial encounter for closed fracture: Secondary | ICD-10-CM | POA: Diagnosis not present

## 2015-02-06 DIAGNOSIS — S92352D Displaced fracture of fifth metatarsal bone, left foot, subsequent encounter for fracture with routine healing: Secondary | ICD-10-CM | POA: Diagnosis not present

## 2015-02-06 DIAGNOSIS — S92002D Unspecified fracture of left calcaneus, subsequent encounter for fracture with routine healing: Secondary | ICD-10-CM

## 2015-02-06 DIAGNOSIS — S92302A Fracture of unspecified metatarsal bone(s), left foot, initial encounter for closed fracture: Secondary | ICD-10-CM

## 2015-02-06 DIAGNOSIS — X58XXXD Exposure to other specified factors, subsequent encounter: Secondary | ICD-10-CM

## 2015-02-06 NOTE — Assessment & Plan Note (Signed)
Casted today. Continue nonweightbearing status for 2 more weeks. Recheck in 2 weeks.

## 2015-02-06 NOTE — Patient Instructions (Signed)
Thank you for coming in today. Return in 2 weeks,  I will discuss this with the orthopedists.

## 2015-02-06 NOTE — Progress Notes (Signed)
Steven Ashley is a 64 y.o. male who presents to [facility]: [facility]  today for follow-up left foot fracture. Patient was seen last week and diagnosed with a mildly displaced proximal fifth metatarsal fracture. He had some other potentially calcaneus fracture noted on x-ray and CT scan is obtained which shows an avulsion fracture of the calcaneus in a fracture of the os perineum.  He was placed into a posterior leg splint and has done well over the past week with nonweightbearing status. He notes moderate to mild dull pain and occasional sharp pain in the lateral dorsal foot. He denies any fevers chills chest pains or palpitations.    No past medical history on file. Past Surgical History  Procedure Laterality Date  . Cervical disc surgery  2005   Social History  Substance Use Topics  . Smoking status: Never Smoker   . Smokeless tobacco: Not on file  . Alcohol Use: Yes   family history includes Benign prostatic hyperplasia in his father; Coronary artery disease in his mother; Dementia in his mother; Diabetes in an other family member; Diabetic kidney disease in his mother; Heart disease in his mother; Parkinsonism in his father.  ROS as above Medications: Current Outpatient Prescriptions  Medication Sig Dispense Refill  . Multiple Vitamin (MULTIVITAMIN WITH MINERALS) TABS Take 1 tablet by mouth daily. [place]    . saw palmetto 160 MG capsule Take 160 mg by mouth daily.    . sildenafil (VIAGRA) 100 MG tablet Take 1 tablet (100 mg total) by mouth daily as needed for erectile dysfunction. 3 tablet 0   No current facility-administered medications for this visit.   Allergies  Allergen Reactions  . Tetracycline     REACTION: Rash     Exam:  BP 112/78 mmHg  Pulse 68  Wt 244 lb (110.678 kg) Gen: Well NAD Left foot swollen with significant ecchymosis. Tender to palpation dorsolateral foot. Capillary refill and pulses intact distally..   No results found for  this or any previous visit (from the past 24 hour(s)). Ct Foot Left Wo Contrast  02/04/2015  CLINICAL DATA:  Evaluate lateral foot fractures. EXAM: CT OF THE LEFT FOOT WITHOUT CONTRAST TECHNIQUE: Multidetector CT imaging of the left foot was performed according to the standard protocol. Multiplanar CT image reconstructions were also generated. COMPARISON:  Radiographs 01/31/2015 FINDINGS: As demonstrated on the radiographs there is a transverse fracture at the base of the fifth metatarsal with an intra-articular component. There is also an avulsion fracture off an elongated process at the base of the fifth metatarsal and a smaller adjacent avulsion fracture. There also avulsion fractures off of the anterior and lateral aspect of the calcaneus at the upper aspect of the calcaneocuboid joint. There is also a fracture of today large os perineum. These are typically associated with a peroneus longus tendon tear/ rupture. The tibiotalar and subtalar joints are maintained. Rounded density near the anterior process of the calcaneus is likely an old avulsion fracture or unfused secondary ossification center. The tarsal metatarsal joints are maintained. IMPRESSION: 1. Fractures involving the base of the fifth metatarsal as described above. 2. Avulsion fractures off of the anterior lateral and superior aspect of the calcaneus. 3. Fracture of a large os perineum. These can be associated with peroneus longus tendon tear/ruptures. Electronically Signed   By: Marijo Sanes M.D.   On: 02/04/2015 16:28   Dg Foot Complete Left  02/06/2015  CLINICAL DATA:  Acute traumatic fracture involving the 5th  metatarsal of the left foot on 01/29/2015. Evaluate for healing. Subsequent encounter. EXAM: LEFT FOOT - COMPLETE 3+ VIEW COMPARISON:  Left foot x-rays and CT 01/29/2015. FINDINGS: Interval mild osseous resorption at the fracture site involving the base the 5th metatarsal, as expected. Fracture involving a large os perineum is less  well visualized today due to the positioning of the foot during imaging. The calcaneal avulsion fracture identified on CT is not well visualized on the x-ray. No new osseous abnormality. Diffuse soft tissue swelling. IMPRESSION: 1. Expected acute healing changes of the traumatic fracture involving the base the 5th metatarsal. 2. Previously identified fracture of the os perineum and the avulsion fracture arising from the calcaneus identified on the prior CT are less well visualized today. 3. No new osseous abnormality. Electronically Signed   By: Evangeline Dakin M.D.   On: 02/06/2015 13:37     Patient was placed into a well fitted short leg nonweightbearing cast.   Please see individual assessment and plan sections.

## 2015-02-06 NOTE — Assessment & Plan Note (Signed)
Doing well does not require surgery. Continue conservative management.

## 2015-02-09 MED ORDER — CYCLOBENZAPRINE HCL 10 MG PO TABS: 10.0000 mg | ORAL_TABLET | Freq: Three times a day (TID) | ORAL | Status: DC | PRN

## 2015-02-09 MED ORDER — IBUPROFEN 800 MG PO TABS: 800.0000 mg | ORAL_TABLET | Freq: Three times a day (TID) | ORAL | Status: DC | PRN

## 2015-02-09 NOTE — Addendum Note (Signed)
Addended by: Gregor Hams on: 02/09/2015 08:53 AM   Modules accepted: Orders

## 2015-02-09 NOTE — Telephone Encounter (Signed)
Called by after hours nurse per pt never sent ibuprofen 800mg  or muscle relaxer. [place] for both and nurse sent to pharmacy.

## 2015-02-20 ENCOUNTER — Ambulatory Visit (INDEPENDENT_AMBULATORY_CARE_PROVIDER_SITE_OTHER): Payer: 59 | Admitting: Family Medicine

## 2015-02-20 ENCOUNTER — Ambulatory Visit: Payer: 59 | Admitting: Family Medicine

## 2015-02-20 ENCOUNTER — Encounter: Payer: Self-pay | Admitting: Family Medicine

## 2015-02-20 ENCOUNTER — Ambulatory Visit (INDEPENDENT_AMBULATORY_CARE_PROVIDER_SITE_OTHER): Payer: 59

## 2015-02-20 VITALS — BP 142/89 | HR 68 | Wt 241.0 lb

## 2015-02-20 DIAGNOSIS — S92302D Fracture of unspecified metatarsal bone(s), left foot, subsequent encounter for fracture with routine healing: Secondary | ICD-10-CM

## 2015-02-20 DIAGNOSIS — S92002D Unspecified fracture of left calcaneus, subsequent encounter for fracture with routine healing: Secondary | ICD-10-CM

## 2015-02-20 DIAGNOSIS — S92352D Displaced fracture of fifth metatarsal bone, left foot, subsequent encounter for fracture with routine healing: Secondary | ICD-10-CM | POA: Diagnosis not present

## 2015-02-20 DIAGNOSIS — X58XXXD Exposure to other specified factors, subsequent encounter: Secondary | ICD-10-CM | POA: Diagnosis not present

## 2015-02-20 NOTE — Assessment & Plan Note (Signed)
Transitioned a cam walker boot with limited weight bearing. Recheck 2 weeks.

## 2015-02-20 NOTE — Patient Instructions (Signed)
Thank you for coming in today. Return in 2 weeks.  Advance weightbearing as tolerated.

## 2015-02-20 NOTE — Progress Notes (Signed)
Steven Ashley is a 64 y.o. male who presents to [facility]: [facility]  today for follow-up fracture. Patient presents to clinic to follow-up fracture of his left fifth proximal metatarsal and his left calcaneus. This was first x-rayed on October 6. Since then he's been immobilized with splinting and casting nonweightbearing. He notes pain is resolved. He feels well otherwise.   No past medical history on file. Past Surgical History  Procedure Laterality Date  . Cervical disc surgery  2005   Social History  Substance Use Topics  . Smoking status: Never Smoker   . Smokeless tobacco: Not on file  . Alcohol Use: Yes   family history includes Benign prostatic hyperplasia in his father; Coronary artery disease in his mother; Dementia in his mother; Diabetes in an other family member; Diabetic kidney disease in his mother; Heart disease in his mother; Parkinsonism in his father.  ROS as above Medications: Current Outpatient Prescriptions  Medication Sig Dispense Refill  . cyclobenzaprine (FLEXERIL) 10 MG tablet Take 1 tablet (10 mg total) by mouth 3 (three) times daily as needed for muscle spasms. 30 tablet 0  . ibuprofen (ADVIL,MOTRIN) 800 MG tablet Take 1 tablet (800 mg total) by mouth every 8 (eight) hours as needed. 60 tablet 2  . Multiple Vitamin (MULTIVITAMIN WITH MINERALS) TABS Take 1 tablet by mouth daily. [place]    . saw palmetto 160 MG capsule Take 160 mg by mouth daily.    . sildenafil (VIAGRA) 100 MG tablet Take 1 tablet (100 mg total) by mouth daily as needed for erectile dysfunction. 3 tablet 0   No current facility-administered medications for this visit.   Allergies  Allergen Reactions  . Tetracycline     REACTION: Rash     Exam:  BP 142/89 mmHg  Pulse 68  Wt 241 lb (109.317 kg)  Gen: Well NAD HEENT: EOMI,  MMM Left foot some mild swelling laterally along with some ecchymosis. Nontender. Pulses capillary refill and sensation  intact.   X-ray left foot pending showing healing mildly displaced fractures. Awaiting formal radiology review.  No results found for this or any previous visit (from the past 24 hour(s)). Dg Foot Complete Left  02/20/2015  CLINICAL DATA:  Foot fracture follow-up. EXAM: LEFT FOOT - COMPLETE 3+ VIEW COMPARISON:  02/06/2015 FINDINGS: Unchanged appearance of fifth metatarsal base avulsion fracture with moderate distraction. Unchanged os peroneus fracture with distraction. Lateral calcaneocuboid fractures are not well visualized. Stable midfoot and hindfoot alignment. No new osseous abnormality. IMPRESSION: Stable alignment of fifth metatarsal base and os peroneus fractures. Electronically Signed   By: Monte Fantasia M.D.   On: 02/20/2015 13:06     Please see individual assessment and plan sections.

## 2015-02-20 NOTE — Progress Notes (Signed)
Quick Note:  Xray shows healing ______

## 2015-03-06 ENCOUNTER — Encounter: Payer: Self-pay | Admitting: Family Medicine

## 2015-03-06 ENCOUNTER — Ambulatory Visit (INDEPENDENT_AMBULATORY_CARE_PROVIDER_SITE_OTHER): Payer: 59

## 2015-03-06 ENCOUNTER — Ambulatory Visit (INDEPENDENT_AMBULATORY_CARE_PROVIDER_SITE_OTHER): Payer: 59 | Admitting: Family Medicine

## 2015-03-06 VITALS — BP 139/68 | HR 74 | Wt 246.0 lb

## 2015-03-06 DIAGNOSIS — S92002D Unspecified fracture of left calcaneus, subsequent encounter for fracture with routine healing: Secondary | ICD-10-CM

## 2015-03-06 DIAGNOSIS — S92302D Fracture of unspecified metatarsal bone(s), left foot, subsequent encounter for fracture with routine healing: Secondary | ICD-10-CM | POA: Diagnosis not present

## 2015-03-06 DIAGNOSIS — S92352D Displaced fracture of fifth metatarsal bone, left foot, subsequent encounter for fracture with routine healing: Secondary | ICD-10-CM

## 2015-03-06 DIAGNOSIS — X58XXXD Exposure to other specified factors, subsequent encounter: Secondary | ICD-10-CM

## 2015-03-06 NOTE — Patient Instructions (Signed)
Thank you for coming in today. Continue the walking boot.  Work on ankle motion.  Use a compression stocking.  You may be developing a fibrous union or a non-union.  Return in 2 weeks.

## 2015-03-06 NOTE — Assessment & Plan Note (Signed)
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 Clinically he is doing excellently. Continue cam walker boot for 2 more weeks and then likely transition to ASO brace based on level of pain. We had a lengthy discussion of what a nonunion or fibrous union means. He expresses understanding and agreement.

## 2015-03-06 NOTE — Progress Notes (Signed)
Quick Note:  No change in bone on xray like we talked about ______

## 2015-03-06 NOTE — Progress Notes (Signed)
Steven Ashley is a 64 y.o. male who presents to [facility]  today for follow-up fracture. Patient was originally seen on October 6 for a fracture of his left fifth metatarsal base and the lateral and superior avulsion fractures of the calcaneus. This is confirmed with CT scan of the foot on October 12. He was treated with nonweightbearing casting and splinting until October 28 where he was transitioned into a cam walker boot. In the interim he has done well. He is doing full weightbearing in the cam walker boot. He notes only some mild soreness at the end of the day. He takes the boot off at home and does some mild range of motion exercises. Sometimes she does walk without the boot at home.   No past medical history on file. Past Surgical History  Procedure Laterality Date  . Cervical disc surgery  2005   Social History  Substance Use Topics  . Smoking status: Never Smoker   . Smokeless tobacco: Not on file  . Alcohol Use: Yes   family history includes Benign prostatic hyperplasia in his father; Coronary artery disease in his mother; Dementia in his mother; Diabetes in an other family member; Diabetic kidney disease in his mother; Heart disease in his mother; Parkinsonism in his father.  ROS as above Medications: Current Outpatient Prescriptions  Medication Sig Dispense Refill  . cyclobenzaprine (FLEXERIL) 10 MG tablet Take 1 tablet (10 mg total) by mouth 3 (three) times daily as needed for muscle spasms. 30 tablet 0  . ibuprofen (ADVIL,MOTRIN) 800 MG tablet Take 1 tablet (800 mg total) by mouth every 8 (eight) hours as needed. 60 tablet 2  . Multiple Vitamin (MULTIVITAMIN WITH MINERALS) TABS Take 1 tablet by mouth daily. [place]    . saw palmetto 160 MG capsule Take 160 mg by mouth daily.    . sildenafil (VIAGRA) 100 MG tablet Take 1 tablet (100 mg total) by mouth daily as needed for erectile dysfunction. 3 tablet 0   No current facility-administered  medications for this visit.   Allergies  Allergen Reactions  . Tetracycline     REACTION: Rash     Exam:  BP 139/68 mmHg  Pulse 74  Wt 246 lb (111.585 kg)  SpO2 98% Gen: Well NAD Left ankle and foot is well-appearing. There is some dry scaly skin on the lateral ankle. It's mildly swollen compared to the contralateral right side. The lateral foot and ankle is nontender. Pulses capillary refill and sensation are intact.  Left foot x-ray: Preliminary read shows a continued mildly displaced fifth metatarsal fracture and calcaneus fracture. No significant bone bridging. No change in fracture distance. Appears stable. Awaiting formal radiology read.  No results found for this or any previous visit (from the past 24 hour(s)). No results found.   Please see individual assessment and plan sections.

## 2015-03-18 ENCOUNTER — Ambulatory Visit (INDEPENDENT_AMBULATORY_CARE_PROVIDER_SITE_OTHER): Payer: 59

## 2015-03-18 ENCOUNTER — Encounter: Payer: Self-pay | Admitting: Family Medicine

## 2015-03-18 ENCOUNTER — Ambulatory Visit (INDEPENDENT_AMBULATORY_CARE_PROVIDER_SITE_OTHER): Payer: 59 | Admitting: Family Medicine

## 2015-03-18 VITALS — BP 133/76 | HR 70 | Wt 243.0 lb

## 2015-03-18 DIAGNOSIS — X58XXXD Exposure to other specified factors, subsequent encounter: Secondary | ICD-10-CM | POA: Diagnosis not present

## 2015-03-18 DIAGNOSIS — S92302D Fracture of unspecified metatarsal bone(s), left foot, subsequent encounter for fracture with routine healing: Secondary | ICD-10-CM

## 2015-03-18 DIAGNOSIS — S92002K Unspecified fracture of left calcaneus, subsequent encounter for fracture with nonunion: Secondary | ICD-10-CM

## 2015-03-18 DIAGNOSIS — S92352D Displaced fracture of fifth metatarsal bone, left foot, subsequent encounter for fracture with routine healing: Secondary | ICD-10-CM

## 2015-03-18 NOTE — Progress Notes (Signed)
Steven Ashley is a 64 y.o. male who presents to [facility]: [facility]  today for foot fracture. Patient was originally seen in early October for fracture of the left foot and calcaneus. This is confirmed with CT scan. He was placed into a cast and subsequently into a boot. He's here today doing quite well. Is ambulating and a non-Cam Walker boot intermittently and feels great. He denies any radiating pain weakness or numbness fevers or chills.   No past medical history on file. Past Surgical History  Procedure Laterality Date  . Cervical disc surgery  2005   Social History  Substance Use Topics  . Smoking status: Never Smoker   . Smokeless tobacco: Not on file  . Alcohol Use: Yes   family history includes Benign prostatic hyperplasia in his father; Coronary artery disease in his mother; Dementia in his mother; Diabetes in an other family member; Diabetic kidney disease in his mother; Heart disease in his mother; Parkinsonism in his father.  ROS as above Medications: Current Outpatient Prescriptions  Medication Sig Dispense Refill  . cyclobenzaprine (FLEXERIL) 10 MG tablet Take 1 tablet (10 mg total) by mouth 3 (three) times daily as needed for muscle spasms. 30 tablet 0  . ibuprofen (ADVIL,MOTRIN) 800 MG tablet Take 1 tablet (800 mg total) by mouth every 8 (eight) hours as needed. 60 tablet 2  . Multiple Vitamin (MULTIVITAMIN WITH MINERALS) TABS Take 1 tablet by mouth daily. [place]    . saw palmetto 160 MG capsule Take 160 mg by mouth daily.    . sildenafil (VIAGRA) 100 MG tablet Take 1 tablet (100 mg total) by mouth daily as needed for erectile dysfunction. 3 tablet 0   No current facility-administered medications for this visit.   Allergies  Allergen Reactions  . Tetracycline     REACTION: Rash     Exam:  BP 133/76 mmHg  Pulse 70  Wt 243 lb (110.224 kg) Gen: Well NAD The foot slightly swollen no ecchymosis nontender pulses capillary refill and  sensation intact. Motion is slightly limited.  X-ray reviewed. Nonunion apparent.  No results found for this or any previous visit (from the past 24 hour(s)). No results found.   Please see individual assessment and plan sections.

## 2015-03-18 NOTE — Patient Instructions (Signed)
Thank you for coming in today. Return in 3-4 weeks.  Use the ankle brace.  Go back to the boot if you have pain.

## 2015-03-18 NOTE — Progress Notes (Signed)
Quick Note:  Stable xray ______

## 2015-03-18 NOTE — Assessment & Plan Note (Signed)
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 He is doing clinically well. 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 Transition to ASO brace and work on ankle strengthening and range of motion. Return in a few weeks.

## 2015-04-24 ENCOUNTER — Ambulatory Visit (INDEPENDENT_AMBULATORY_CARE_PROVIDER_SITE_OTHER): Payer: 59

## 2015-04-24 ENCOUNTER — Ambulatory Visit (INDEPENDENT_AMBULATORY_CARE_PROVIDER_SITE_OTHER): Payer: 59 | Admitting: Family Medicine

## 2015-04-24 VITALS — BP 148/81 | HR 74 | Temp 97.9°F | Resp 18 | Wt 248.5 lb

## 2015-04-24 DIAGNOSIS — S92302D Fracture of unspecified metatarsal bone(s), left foot, subsequent encounter for fracture with routine healing: Secondary | ICD-10-CM

## 2015-04-24 DIAGNOSIS — X58XXXD Exposure to other specified factors, subsequent encounter: Secondary | ICD-10-CM

## 2015-04-24 DIAGNOSIS — S92302K Fracture of unspecified metatarsal bone(s), left foot, subsequent encounter for fracture with nonunion: Secondary | ICD-10-CM

## 2015-04-24 DIAGNOSIS — S92352G Displaced fracture of fifth metatarsal bone, left foot, subsequent encounter for fracture with delayed healing: Secondary | ICD-10-CM

## 2015-04-24 MED ORDER — DICLOFENAC SODIUM 1 % TD GEL: 2.0000 g | Freq: Four times a day (QID) | TRANSDERMAL | Status: DC

## 2015-04-24 NOTE — Patient Instructions (Signed)
Thank you for coming in today. Use the voltaren gel.  Use the full ankle body helix brace.  You are likely a large.  Return as needed.

## 2015-04-24 NOTE — Progress Notes (Signed)
       Steven Ashley is a 64 y.o. male who presents to [facility]: [facility] today for follow-up left foot fracture. Patient was originally seen in early October for a fracture at the base of the fifth metatarsal and a small fracture of the calcaneus. He was treated conservatively but after 6 weeks it was apparent that this is developing a fibrous union. In the interim he has done pretty well. He denies any significant pain with ambulation. He notes minimal swelling. Overall he feels quite well.  No past medical history on file. Past Surgical History  Procedure Laterality Date  . Cervical disc surgery  2005   Social History  Substance Use Topics  . Smoking status: Never Smoker   . Smokeless tobacco: Not on file  . Alcohol Use: Yes   family history includes Benign prostatic hyperplasia in his father; Coronary artery disease in his mother; Dementia in his mother; Diabetic kidney disease in his mother; Heart disease in his mother; Parkinsonism in his father.  ROS as above Medications: Current Outpatient Prescriptions  Medication Sig Dispense Refill  . Multiple Vitamin (MULTIVITAMIN WITH MINERALS) TABS Take 1 tablet by mouth daily. [place]    . saw palmetto 160 MG capsule Take 160 mg by mouth daily.    . sildenafil (VIAGRA) 100 MG tablet Take 1 tablet (100 mg total) by mouth daily as needed for erectile dysfunction. 3 tablet 0  . diclofenac sodium (VOLTAREN) 1 % GEL Apply 2 g topically 4 (four) times daily. To affected joint. 100 g 11   No current facility-administered medications for this visit.   Allergies  Allergen Reactions  . Tetracycline     REACTION: Rash     Exam:  BP 148/81 mmHg  Pulse 74  Temp(Src) 97.9 F (36.6 C)  Resp 18  Wt 248 lb 8 oz (112.719 kg)  SpO2 97% Gen: Well NAD Left foot slightly swollen laterally. Nontender. Normal motion pulses capillary refill and  sensation.    No results found for this or any previous visit (from the past 24 hour(s)). Dg Foot Complete Left  04/24/2015  CLINICAL DATA:  Follow-up left fifth metatarsal fracture with continued foot swelling. EXAM: LEFT FOOT - COMPLETE 3+ VIEW COMPARISON:  03/18/2015, 03/06/2015, CT foot 02/04/2015 FINDINGS: There is no acute fracture or dislocation. There is an ununited fracture at the base of the fifth metatarsal unchanged from the prior exams. 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 There is no lytic or sclerotic osseous lesion. Soft tissues are normal. IMPRESSION: 1. 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 2. 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 If there is further clinical concern, recommend evaluation with MRI. Electronically Signed   By: Kathreen Devoid   On: 04/24/2015 08:58     Please see individual assessment and plan sections.

## 2015-04-24 NOTE — Assessment & Plan Note (Signed)
Overall patient is feeling pretty well. 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 His symptoms are very minimal. He states he does not want to pursue surgery at this time. He will advance his activity to full activities including golf and exercise. Recommend compression sleeve and topical diclofenac gel as needed. Return as needed.

## 2015-05-01 ENCOUNTER — Encounter: Payer: Self-pay | Admitting: Family Medicine

## 2015-05-01 ENCOUNTER — Ambulatory Visit (INDEPENDENT_AMBULATORY_CARE_PROVIDER_SITE_OTHER): Payer: 59 | Admitting: Family Medicine

## 2015-05-01 VITALS — BP 136/74 | HR 64 | Temp 98.3°F | Resp 18 | Ht 70.0 in | Wt 244.6 lb

## 2015-05-01 DIAGNOSIS — R7989 Other specified abnormal findings of blood chemistry: Secondary | ICD-10-CM

## 2015-05-01 DIAGNOSIS — Z0000 Encounter for general adult medical examination without abnormal findings: Secondary | ICD-10-CM | POA: Diagnosis not present

## 2015-05-01 DIAGNOSIS — Z23 Encounter for immunization: Secondary | ICD-10-CM

## 2015-05-01 LAB — COMPLETE METABOLIC PANEL WITH GFR
ALT: 32 U/L (ref 9–46)
AST: 25 U/L (ref 10–35)
Albumin: 4.7 g/dL (ref 3.6–5.1)
Alkaline Phosphatase: 61 U/L (ref 40–115)
BILIRUBIN TOTAL: 0.4 mg/dL (ref 0.2–1.2)
BUN: 18 mg/dL (ref 7–25)
CALCIUM: 9.8 mg/dL (ref 8.6–10.3)
CO2: 30 mmol/L (ref 20–31)
CREATININE: 1.21 mg/dL (ref 0.70–1.25)
Chloride: 103 mmol/L (ref 98–110)
GFR, EST AFRICAN AMERICAN: 71 mL/min (ref >=60)
GFR, Est Non African American: 62 mL/min (ref >=60)
GLUCOSE: 110 mg/dL — AB (ref 65–99)
Potassium: 5 mmol/L (ref 3.5–5.3)
Sodium: 141 mmol/L (ref 135–146)
Total Protein: 7 g/dL (ref 6.1–8.1)

## 2015-05-01 LAB — CBC
HEMATOCRIT: 46.4 % (ref 39.0–52.0)
HEMOGLOBIN: 15.8 g/dL (ref 13.0–17.0)
MCH: 29.5 pg (ref 26.0–34.0)
MCHC: 34.1 g/dL (ref 30.0–36.0)
MCV: 86.7 fL (ref 78.0–100.0)
MPV: 9.5 fL (ref 8.6–12.4)
Platelets: 234 10*3/uL (ref 150–400)
RBC: 5.35 MIL/uL (ref 4.22–5.81)
RDW: 13.5 % (ref 11.5–15.5)
WBC: 7.7 10*3/uL (ref 4.0–10.5)

## 2015-05-01 LAB — LIPID PANEL
CHOL/HDL RATIO: 4.8 ratio (ref <=5.0)
CHOLESTEROL: 226 mg/dL — AB (ref 125–200)
HDL: 47 mg/dL (ref >=40)
LDL CALC: 130 mg/dL — AB (ref <130)
TRIGLYCERIDES: 246 mg/dL — AB (ref <150)
VLDL: 49 mg/dL — AB (ref <30)

## 2015-05-01 NOTE — Patient Instructions (Signed)
Keep up a regular exercise program and make sure you are eating a healthy diet Try to eat 4 servings of dairy a day, or if you are lactose intolerant take a calcium with vitamin D daily.  Your vaccines are up to date.   

## 2015-05-01 NOTE — Progress Notes (Signed)
Subjective:    Patient ID: Steven Ashley, male    DOB: September 23, 1950, 65 y.o.   MRN: [id number]  HPI Here for CPE. He is doing well overall. He has not been able to exercise because of a fracture in his foot. He was just released yesterday by the sports medicine provider. He is still working on rehabbing it at home.   Review of Systems   Comprehensive ROS is neg.   There were no vitals taken for this visit.    Allergies  Allergen Reactions  . Tetracycline     REACTION: Rash    No past medical history on file.  Past Surgical History  Procedure Laterality Date  . Cervical disc surgery  2005    Social History   Social History  . Marital Status: Married    Spouse Name: N/A  . Number of Children: 4  . Years of Education: N/A   Occupational History  . Not on file.   Social History Main Topics  . Smoking status: Never Smoker   . Smokeless tobacco: Not on file  . Alcohol Use: Yes  . Drug Use: No  . 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   Other Topics Concern  . Not on file   Social History Narrative   Some regular exercise.      Family History  Problem Relation Age of Onset  . Diabetes    . Heart disease Mother   . Parkinsonism Father     deceased  . Coronary artery disease Mother     smoker  . Dementia Mother   . Diabetic kidney disease Mother   . Benign prostatic hyperplasia Father     TURP    Outpatient Encounter Prescriptions as of 05/01/2015  Medication Sig  . diclofenac sodium (VOLTAREN) 1 % GEL Apply 2 g topically 4 (four) times daily. To affected joint.  . Multiple Vitamin (MULTIVITAMIN WITH MINERALS) TABS Take 1 tablet by mouth daily. [place]  . saw palmetto 160 MG capsule Take 160 mg by mouth daily.  . sildenafil (VIAGRA) 100 MG tablet Take 1 tablet (100 mg total) by mouth daily as needed for erectile dysfunction.   No facility-administered encounter medications on file as of 05/01/2015.          Objective:   Physical Exam  Constitutional: He is oriented to person, place, and time. He appears well-developed and well-nourished.  HENT:  Head: Normocephalic and atraumatic.  Right Ear: External ear normal.  Left Ear: External ear normal.  Nose: Nose normal.  Mouth/Throat: Oropharynx is clear and moist.  Eyes: Conjunctivae and EOM are normal. Pupils are equal, round, and reactive to light.  Neck: Normal range of motion. Neck supple. No thyromegaly present.  Cardiovascular: Normal rate, regular rhythm, normal heart sounds and intact distal pulses.   Pulmonary/Chest: Effort normal and breath sounds normal.  Abdominal: Soft. Bowel sounds are normal. He exhibits no distension and no mass. There is no tenderness. There is no rebound and no guarding.  Musculoskeletal: Normal range of motion. He exhibits edema.  1+ pitting edema on the right ankle.   Lymphadenopathy:    He has no cervical adenopathy.  Neurological: He is alert and oriented to person, place, and time. He has normal reflexes.  Skin: Skin is warm and dry.  Psychiatric: He has a normal mood and affect. His behavior is normal. Judgment and thought content normal.  Assessment & Plan:  CPE Keep up a regular exercise program and make sure you are eating a healthy diet Try to eat 4 servings of dairy a day, or if you are lactose intolerant take a calcium with vitamin D daily.  Your vaccines are up to date.   He did have some mildly elevated liver enzymes last time and we did his blood work 2 years ago so we'll recheck that today. He said he very rarely drinks alcohol, maybe one per month. Possible he has a fatty liver. Next  Elevated ferritin-we will recheck this as well. He'll have a great reason for him to have a high ferritin level.  Pneumonia vaccine given today.

## 2015-05-02 LAB — PSA: PSA: 1.72 ng/mL (ref <=4.00)

## 2015-05-02 LAB — FERRITIN: FERRITIN: 564 ng/mL — AB (ref 22–322)

## 2015-06-11 ENCOUNTER — Encounter: Payer: Self-pay | Admitting: Family Medicine

## 2015-09-28 ENCOUNTER — Other Ambulatory Visit: Payer: Self-pay

## 2015-09-28 MED ORDER — SILDENAFIL CITRATE 100 MG PO TABS: 100.0000 mg | ORAL_TABLET | Freq: Every day | ORAL | Status: DC | PRN

## 2015-10-30 ENCOUNTER — Ambulatory Visit (INDEPENDENT_AMBULATORY_CARE_PROVIDER_SITE_OTHER): Payer: BLUE CROSS/BLUE SHIELD | Admitting: Family Medicine

## 2015-10-30 ENCOUNTER — Encounter: Payer: Self-pay | Admitting: Family Medicine

## 2015-10-30 VITALS — BP 136/79 | HR 64 | Wt 240.0 lb

## 2015-10-30 DIAGNOSIS — L80 Vitiligo: Secondary | ICD-10-CM | POA: Diagnosis not present

## 2015-10-30 DIAGNOSIS — R7309 Other abnormal glucose: Secondary | ICD-10-CM | POA: Diagnosis not present

## 2015-10-30 DIAGNOSIS — G479 Sleep disorder, unspecified: Secondary | ICD-10-CM | POA: Diagnosis not present

## 2015-10-30 DIAGNOSIS — N400 Enlarged prostate without lower urinary tract symptoms: Secondary | ICD-10-CM

## 2015-10-30 DIAGNOSIS — L819 Disorder of pigmentation, unspecified: Secondary | ICD-10-CM

## 2015-10-30 MED ORDER — SILODOSIN 4 MG PO CAPS: 4.0000 mg | ORAL_CAPSULE | Freq: Every day | ORAL | Status: DC

## 2015-10-30 NOTE — Progress Notes (Signed)
Subjective:    CC:   HPI: Skin Discoloration he reports that this began about 6 mos ago and he was seen by a dermatologist 1 1/2 wks ago that gave him a topical med for this. He was dx with vitilgo and given protopic.  He doesn't like the greasiness of the ointment. He reports that it started on his face and has spread to his arms and hands.    He is particularly concerned about the vitiligo because it seems to have all popped up over the last 3 months.  For the last 3-6 months he reports that his wife says he's been moving a lot in his sleep. He says overall he feels like he is actually sleeping well. He denies any excess snoring etc. Is not really having any nightmares or sleepwalking. 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  He restarted his vitamin D.   BPH-he did use the Rap[place]flo for about 3 weeks. He noticed a big difference in his frequent urination as well as is urinating at night. Unfortunately he experienced retrograde ejaculation so he stopped it.  He didn't have any other side effects with it.  He did do lab work back in January and had an abnormal glucose at that time.  Past medical history, Surgical history, Family history not pertinant except as noted below, Social history, Allergies, and medications have been entered into the medical record, reviewed, and corrections made.   Review of Systems: No fevers, chills, night sweats, weight loss, chest pain, or shortness of breath.   Objective:    General: Well Developed, well nourished, and in no acute distress.  Neuro: Alert and oriented x3, extra-ocular muscles intact, sensation grossly intact.  HEENT: Normocephalic, atraumatic  Skin: Warm and dry, no rashes. He has vitiligo on his face and his arms.   Cardiac: Regular rate and rhythm, no murmurs rubs or  gallops, no lower extremity edema.  Respiratory: Clear to auscultation bilaterally. Not using accessory muscles, speaking in full sentences.   Impression and Recommendations:   Vitiligo - Confirm diagnosis. Gave reassurance.  Agreed with his dermatologist that the skin is most consistent with vitiligo. Encouraged him to call their office to see if there is an alternative from the Protopic since he doesn't like the greasiness of the ointment. Certainly we can do some baseline blood work since he is worried about something underlying going on. Will check his thyroid. We'll also check inflammatory markers and a CBC. Next  Sleep disturbance-he's had excessive movement in his sleep. Unclear what may be causing this. He actually feels like he sleeping and resting well so at this point in time we will just continue to monitor this. Next  BPH-I did let him know that retrograde ejaculation can occur with these medications. It is not harmful so if he would like to restart the medication we can certainly do so. New prescription sent to the pharmacy.  Abnormal glucose-we'll check hemoglobin A1c.

## 2016-03-12 ENCOUNTER — Other Ambulatory Visit: Payer: Self-pay | Admitting: Family Medicine

## 2016-05-10 DIAGNOSIS — M722 Plantar fascial fibromatosis: Secondary | ICD-10-CM | POA: Diagnosis not present

## 2016-05-31 DIAGNOSIS — H524 Presbyopia: Secondary | ICD-10-CM | POA: Diagnosis not present

## 2016-05-31 DIAGNOSIS — H52223 Regular astigmatism, bilateral: Secondary | ICD-10-CM | POA: Diagnosis not present

## 2016-05-31 DIAGNOSIS — H5212 Myopia, left eye: Secondary | ICD-10-CM | POA: Diagnosis not present

## 2016-05-31 DIAGNOSIS — H35372 Puckering of macula, left eye: Secondary | ICD-10-CM | POA: Diagnosis not present

## 2016-05-31 DIAGNOSIS — H5201 Hypermetropia, right eye: Secondary | ICD-10-CM | POA: Diagnosis not present

## 2016-05-31 DIAGNOSIS — H25811 Combined forms of age-related cataract, right eye: Secondary | ICD-10-CM | POA: Diagnosis not present

## 2016-05-31 DIAGNOSIS — H2513 Age-related nuclear cataract, bilateral: Secondary | ICD-10-CM | POA: Diagnosis not present

## 2016-05-31 DIAGNOSIS — H16223 Keratoconjunctivitis sicca, not specified as Sjogren's, bilateral: Secondary | ICD-10-CM | POA: Diagnosis not present

## 2016-06-14 ENCOUNTER — Ambulatory Visit (INDEPENDENT_AMBULATORY_CARE_PROVIDER_SITE_OTHER): Payer: Medicare HMO | Admitting: Family Medicine

## 2016-06-14 ENCOUNTER — Encounter: Payer: Self-pay | Admitting: Family Medicine

## 2016-06-14 ENCOUNTER — Ambulatory Visit (INDEPENDENT_AMBULATORY_CARE_PROVIDER_SITE_OTHER): Payer: Medicare HMO

## 2016-06-14 VITALS — BP 136/64 | HR 69 | Ht 70.0 in | Wt 238.0 lb

## 2016-06-14 DIAGNOSIS — M542 Cervicalgia: Secondary | ICD-10-CM | POA: Diagnosis not present

## 2016-06-14 DIAGNOSIS — M25511 Pain in right shoulder: Secondary | ICD-10-CM

## 2016-06-14 DIAGNOSIS — M4802 Spinal stenosis, cervical region: Secondary | ICD-10-CM | POA: Diagnosis not present

## 2016-06-14 DIAGNOSIS — M7581 Other shoulder lesions, right shoulder: Secondary | ICD-10-CM

## 2016-06-14 DIAGNOSIS — S46811A Strain of other muscles, fascia and tendons at shoulder and upper arm level, right arm, initial encounter: Secondary | ICD-10-CM | POA: Diagnosis not present

## 2016-06-14 NOTE — Progress Notes (Signed)
Subjective:    Patient ID: Steven Ashley, male    DOB: 1951-03-10, 66 y.o.   MRN: [id number]  HPI 2 months of right shoulder and neck pain. Not sure what he may have done to injury it Says hard to raise his right shoulder. Still playing golf. He does life 50 lb bags of feed occ.  C/O of dull constant pain. Occ uses heat, cold and Advil.    He notices the pain is worse if he rolls his shoulders forward if he actually pulls them back actually feels a little better. Most of his pain is over the scalene area just above the clavicle they see that he also has pain over the trapezius muscle which radiates up into his neck. He reports prior history of cervical disc surgery. No numbness or tingling down the arms.   Review of Systems   BP 140/69   Pulse 69   Ht 5\' 10"  (1.778 m)   Wt 238 lb (108 kg)   SpO2 98%   BMI 34.15 kg/m     Allergies  Allergen Reactions  . Tetracycline     REACTION: Rash    No past medical history on file.  Past Surgical History:  Procedure Laterality Date  . CERVICAL DISC SURGERY  2005    Social History   Social History  . Marital status: Married    Spouse name: N/A  . Number of children: 4  . Years of education: N/A   Occupational History  . Not on file.   Social History Main Topics  . Smoking status: Never Smoker  . Smokeless tobacco: Never Used  . Alcohol use 0.0 oz/week  . Drug use: No  . 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   Other Topics Concern  . Not on file   Social History Narrative   Some regular exercise.      Family History  Problem Relation Age of Onset  . Diabetes    . Heart disease Mother   . Coronary artery disease Mother     smoker  . Dementia Mother   . Diabetic kidney disease Mother   . Parkinsonism Father     deceased  . Benign prostatic hyperplasia Father     TURP    Outpatient Encounter Prescriptions as of 06/14/2016  Medication Sig  .  Multiple Vitamin (MULTIVITAMIN WITH MINERALS) TABS Take 1 tablet by mouth daily. [place]  . RAPAFLO 4 MG CAPS capsule TAKE 1 CAPSULE (4 MG TOTAL) BY MOUTH DAILY WITH BREAKFAST.  . saw palmetto 160 MG capsule Take 160 mg by mouth daily.  . tacrolimus (PROTOPIC) 0.1 % ointment APPLY TWICE A DAY TO AFFECTED AREAS OF VITILIGO  . [DISCONTINUED] sildenafil (VIAGRA) 100 MG tablet Take 1 tablet (100 mg total) by mouth daily as needed for erectile dysfunction.   No facility-administered encounter medications on file as of 06/14/2016.           Objective:   Physical Exam  Constitutional: He is oriented to person, place, and time. He appears well-developed and well-nourished.  HENT:  Head: Normocephalic and atraumatic.  Eyes: Conjunctivae and EOM are normal.  Cardiovascular: Normal rate.   Pulmonary/Chest: Effort normal.  Musculoskeletal:  Bilateral shoulders with normal range of motion and strength in all directions. No weakness with internal or external rotation of the shoulder. Lift off test off his low back is symmetric bilaterally that he  did have pain just above the clavicle with lift off test. Negative empty can test.  Neurological: He is alert and oriented to person, place, and time.  Skin: Skin is dry. No pallor.  Psychiatric: He has a normal mood and affect. His behavior is normal.  Vitals reviewed.       Assessment & Plan:  Right-sided neck and shoulder pain-most consistent with strain of trapezius as well as the scalene muscles. Recommend stretches for upper back and neck. Recommend increase anti-inflammatory to 600 mg 3 times a day. Take with food and water stop immediately if any GI upset or irritation. If not improving them please follow up with sports medicine will consider referral to more formal physical therapy.

## 2016-06-14 NOTE — Patient Instructions (Signed)
Recommend start the home exercises. Please see handout provided. Increase Advil to 3 tabs 3 times a day. That's a total of 600 mg in the morning, noon, and at bedtime. I would like you do this for about 5 days and he can go back to just using it as needed. If your pain is not improving over the next 3-4 weeks and please let us know and we'll get more formal physical therapy or a consultation with one of our sports medicine providers.

## 2016-09-18 ENCOUNTER — Encounter: Payer: Self-pay | Admitting: Family Medicine

## 2016-10-03 IMAGING — CR DG ANKLE COMPLETE 3+V*L*
3 series · 3 of 3 positions shown · non-contrast
Comparison: None.

CLINICAL DATA: Injury.  Pain.  Initial evaluation.

EXAM:
LEFT ANKLE COMPLETE - 3+ VIEW

[ankle ap]
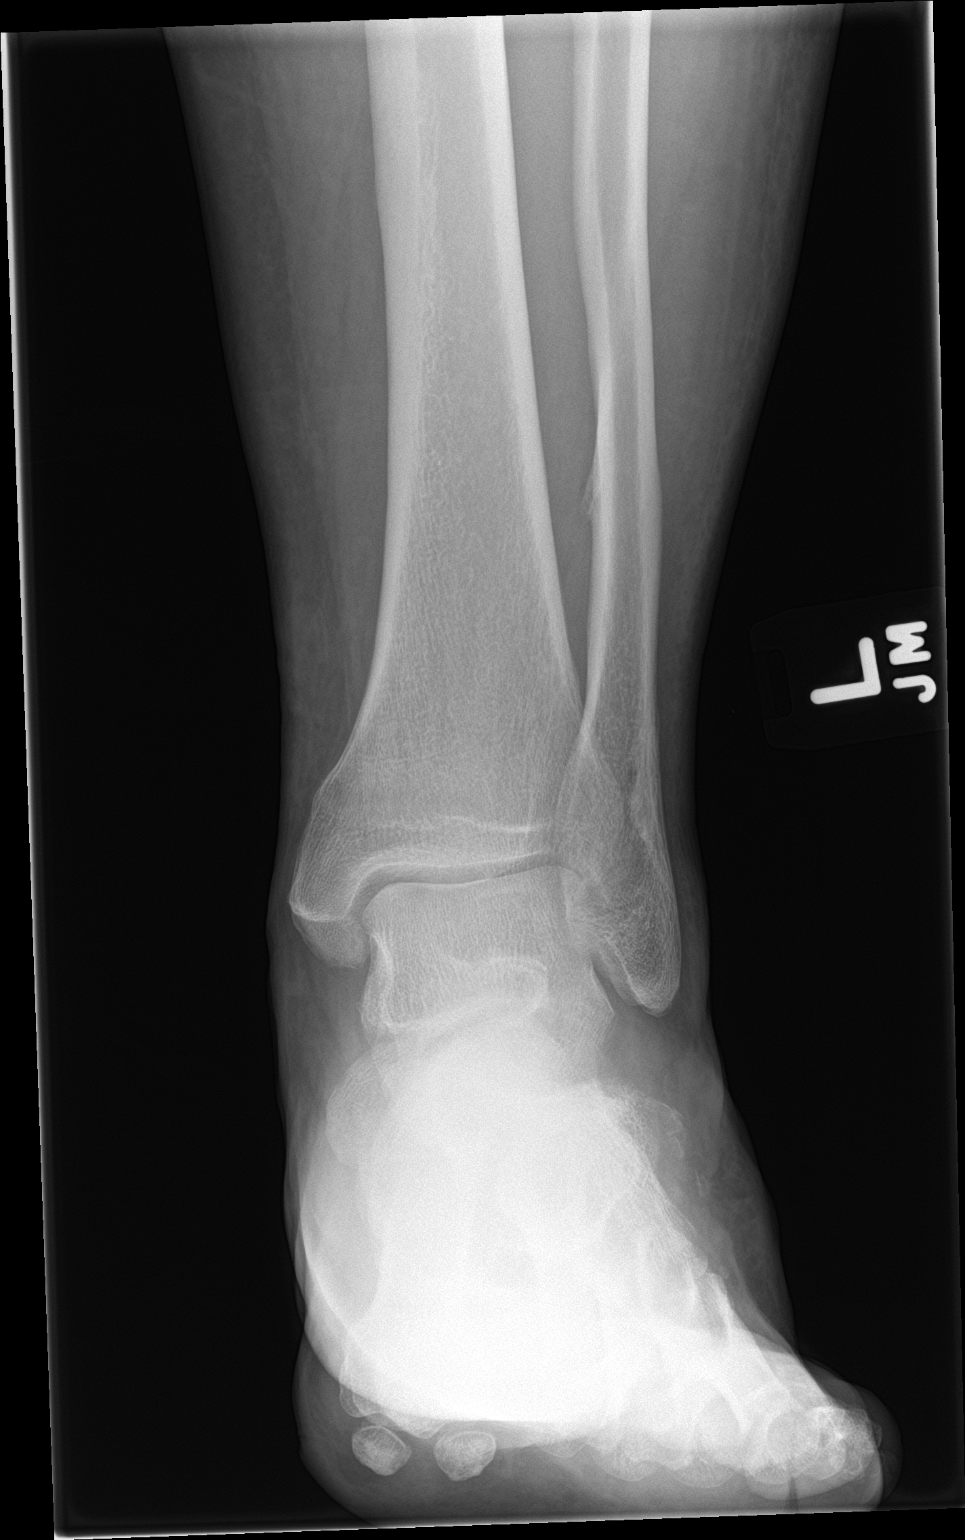

[ankle obl]
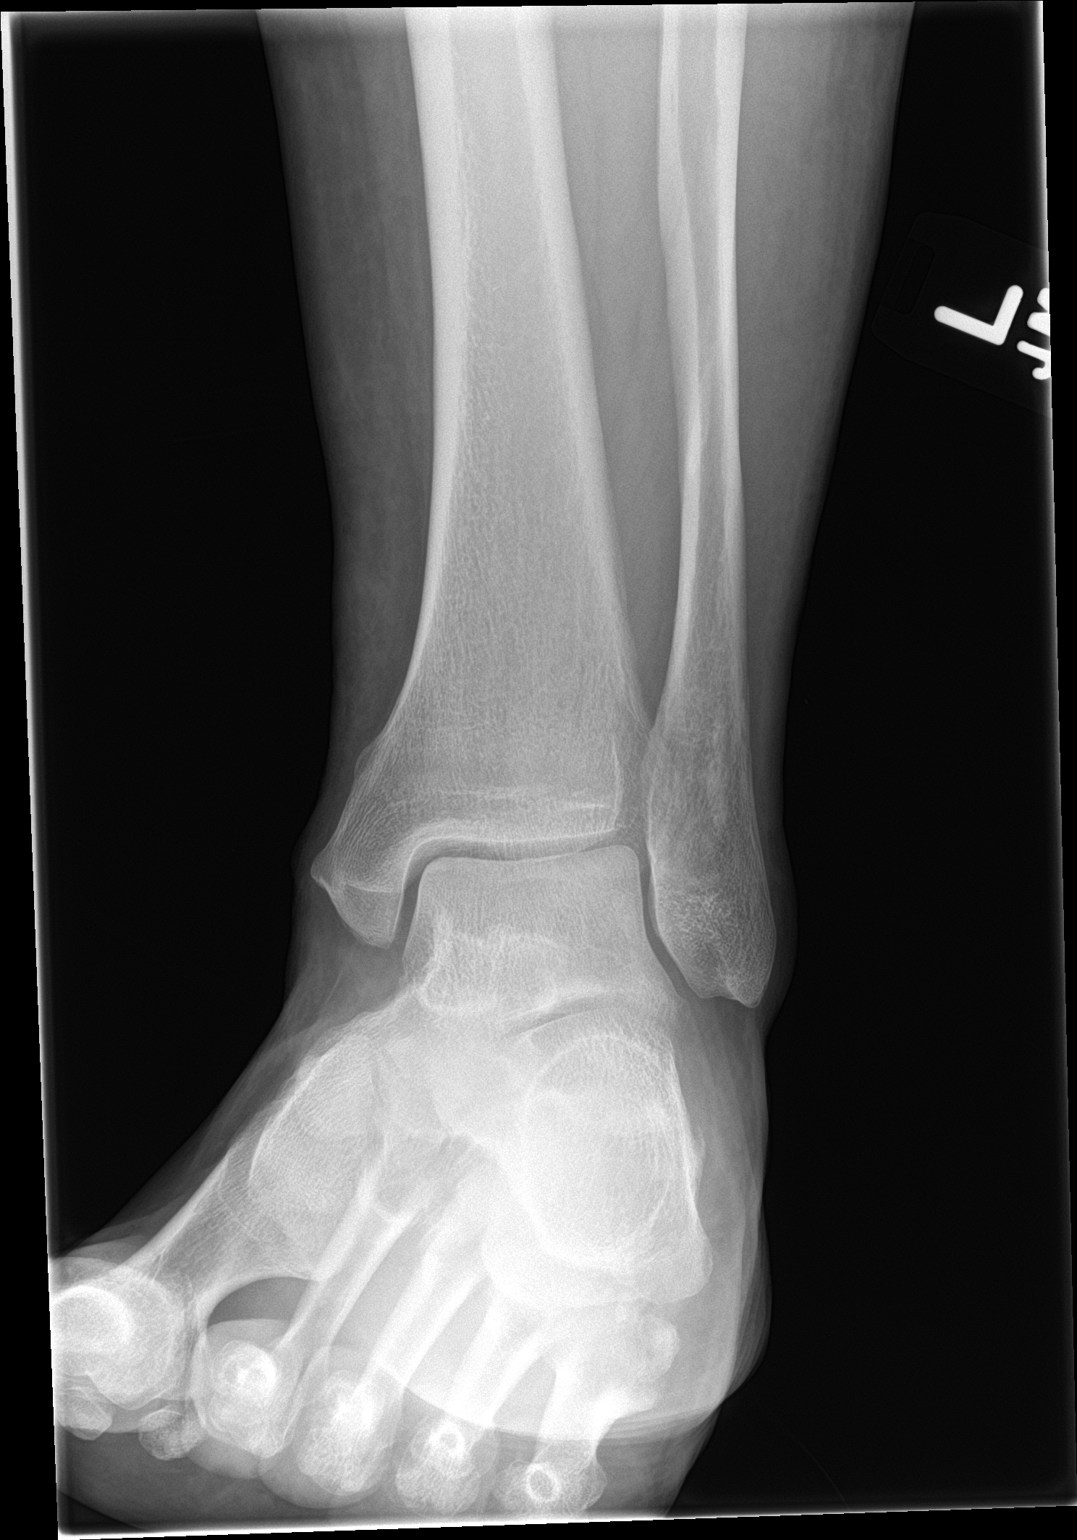

[ankle lat]
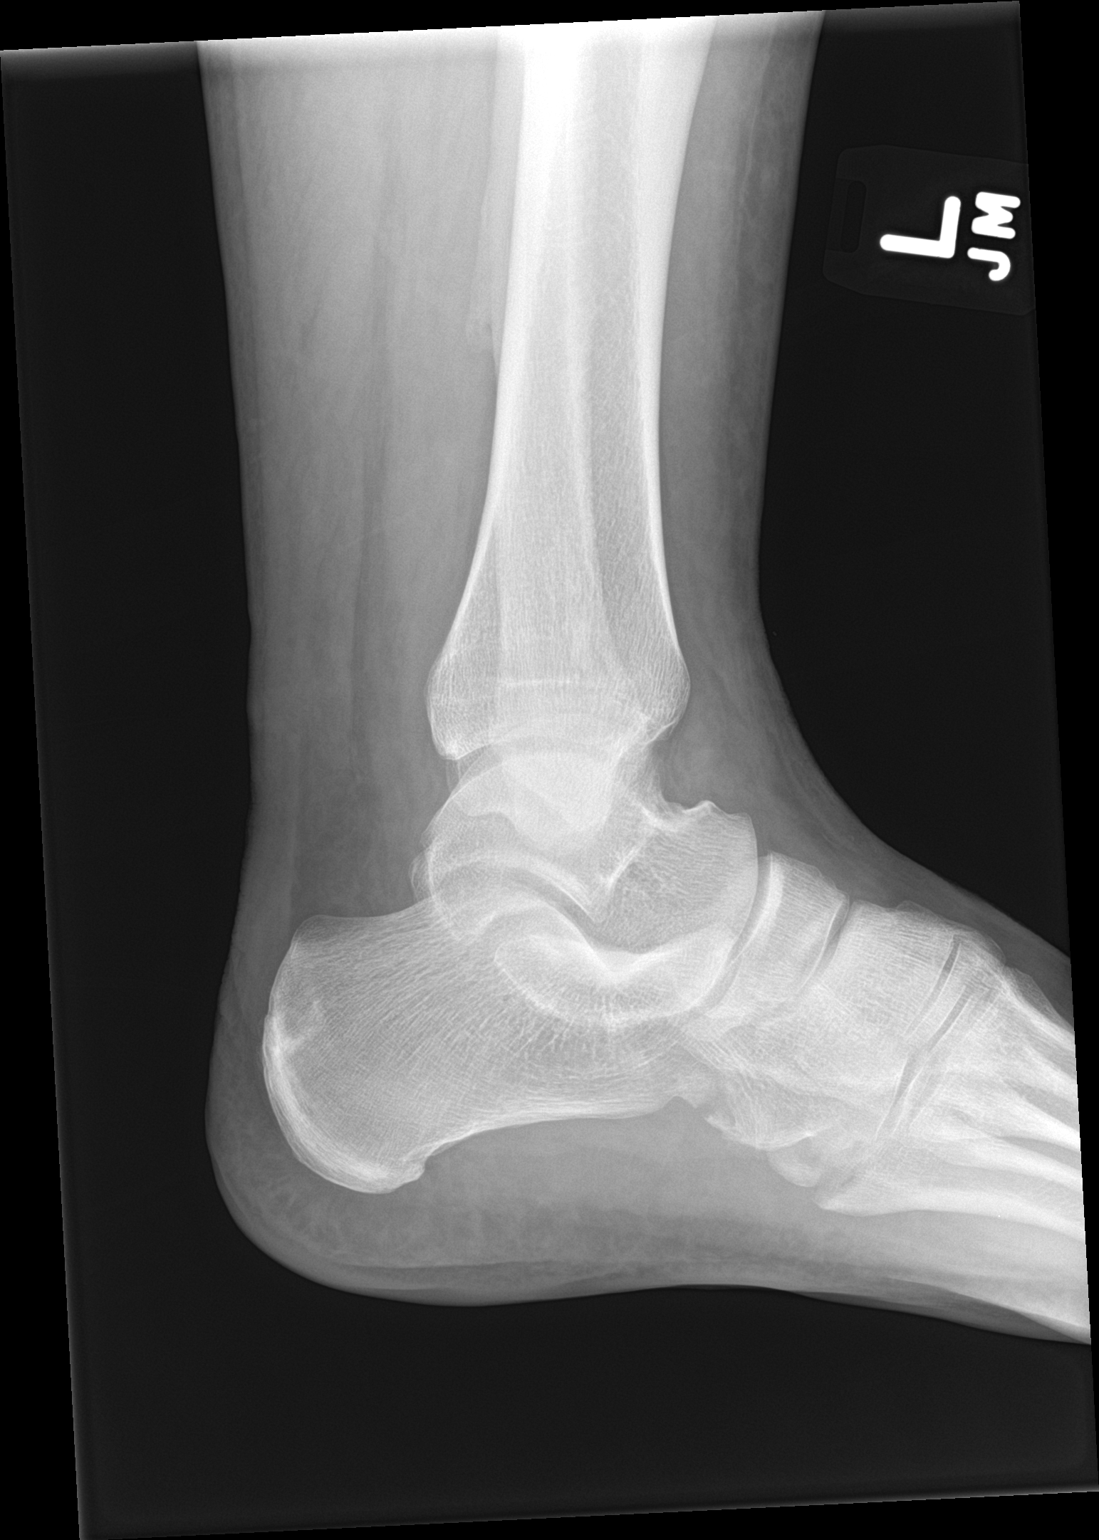

[3 of 3 positions shown; findings below may reference images not displayed]

FINDINGS: Fracture fragments are noted at the base of the left fifth
metatarsal. This is most likely secondary to a fracture of the base
of the left fifth metatarsal. Fracture of the distal aspect of the
calcaneus and of the lateral aspect of the cuboid cannot be
excluded. Malleoli are intact.
IMPRESSION: 1. Malleoli are intact.
2. Fracture fragments of the base of the left fifth metatarsal.
Fracture of the base of the left fifth metatarsal and possibly the
distal aspect of the calcaneus and lateral aspect the cuboid cannot
be excluded.

## 2017-03-08 DIAGNOSIS — H16223 Keratoconjunctivitis sicca, not specified as Sjogren's, bilateral: Secondary | ICD-10-CM | POA: Diagnosis not present

## 2017-03-08 DIAGNOSIS — H35372 Puckering of macula, left eye: Secondary | ICD-10-CM | POA: Diagnosis not present

## 2017-03-08 DIAGNOSIS — H524 Presbyopia: Secondary | ICD-10-CM | POA: Diagnosis not present

## 2017-03-08 DIAGNOSIS — H52223 Regular astigmatism, bilateral: Secondary | ICD-10-CM | POA: Diagnosis not present

## 2017-03-08 DIAGNOSIS — H25811 Combined forms of age-related cataract, right eye: Secondary | ICD-10-CM | POA: Diagnosis not present

## 2017-03-08 DIAGNOSIS — H2513 Age-related nuclear cataract, bilateral: Secondary | ICD-10-CM | POA: Diagnosis not present

## 2017-03-08 DIAGNOSIS — H35461 Secondary vitreoretinal degeneration, right eye: Secondary | ICD-10-CM | POA: Diagnosis not present

## 2017-03-08 DIAGNOSIS — H5213 Myopia, bilateral: Secondary | ICD-10-CM | POA: Diagnosis not present

## 2017-04-12 NOTE — Progress Notes (Signed)
Subjective:    Patient ID: Steven Ashley, male    DOB: Jan 25, 1951, 66 y.o.   MRN: [id number]  HPI 66 yo male comes in today with complaints of bilateral leg pain.  He said that after retirement he eventually decided to go back to work and for about the last year he has been experiencing pain in his legs and particularly cramps in the calves and the anterior portion of his legs mostly at night.  Usually when it happens to get up and taken Advil.  Has been trying to increase his oral potassium by eating things like bananas.  And he did speak to his pharmacist who recommended some magnesium.  He recently started supplementing with 1 tab daily and says that does seem to have helped some.  He currently is working as a Presenter, broadcasting for [employer] so is on his feet for hours at a time.  He admits he only drinks about 1 bottle of water a day.  He has also tried to really work hard on losing weight.  He is decreasing his portion size to see if that would help as well and it does seem to have improved slightly.   Review of Systems  BP (!) 146/81   Pulse 60   Wt 216 lb (98 kg)   SpO2 99%   BMI 30.99 kg/m     Allergies  Allergen Reactions  . Tetracycline     REACTION: Rash    History reviewed. No pertinent past medical history.  Past Surgical History:  Procedure Laterality Date  . CERVICAL DISC SURGERY  2005    Social History   Socioeconomic History  . Marital status: Married    Spouse name: Not on file  . Number of children: 4  . Years of education: Not on file  . Highest education level: Not on file  Social Needs  . Financial resource strain: Not on file  . Food insecurity - worry: Not on file  . Food insecurity - inability: Not on file  . Transportation needs - medical: Not on file  . Transportation needs - non-medical: Not on file  Occupational History  . Not on file  Tobacco Use  . Smoking status: Never Smoker  . Smokeless tobacco: Never Used  Substance and Sexual  Activity  . Alcohol use: Yes    Alcohol/week: 0.0 oz  . Drug use: No  . 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  Other Topics Concern  . Not on file  Social History Narrative   Some regular exercise.      Family History  Problem Relation Age of Onset  . Diabetes Unknown   . Heart disease Mother   . Coronary artery disease Mother        smoker  . Dementia Mother   . Diabetic kidney disease Mother   . Parkinsonism Father        deceased  . Benign prostatic hyperplasia Father        TURP    Outpatient Encounter Medications as of 04/13/2017  Medication Sig  . Magnesium 100 MG TABS Take by mouth.  . Multiple Vitamin (MULTIVITAMIN WITH MINERALS) TABS Take 1 tablet by mouth daily. [place]  . RAPAFLO 4 MG CAPS capsule TAKE 1 CAPSULE (4 MG TOTAL) BY MOUTH DAILY WITH BREAKFAST.  . saw palmetto 160 MG capsule Take 160 mg by mouth daily.  . tacrolimus (PROTOPIC)  0.1 % ointment APPLY TWICE A DAY TO AFFECTED AREAS OF VITILIGO   No facility-administered encounter medications on file as of 04/13/2017.          Objective:   Physical Exam  Constitutional: He is oriented to person, place, and time. He appears well-developed and well-nourished.  HENT:  Head: Normocephalic and atraumatic.  Eyes: Conjunctivae and EOM are normal.  Cardiovascular: Normal rate.  Pulmonary/Chest: Effort normal.  Neurological: He is alert and oriented to person, place, and time.  Skin: Skin is dry. No pallor.  He does have a few bulging varicose veins on the lower legs.  As well as 1+ pitting edema over the shins bilaterally to just below the knees.  Dorsal pedal pulses are 2+.  Capillary refill is less than 3 seconds on both feet.  Good coloration.  No wounds or sores.  Psychiatric: He has a normal mood and affect. His behavior is normal.  Vitals reviewed.       Assessment & Plan:  Varicose veins, lower extremity edema- Recommend a trial of  compression stockings at least above the knee.  Might even consider the thigh-high ones.  Recommend at least moderate compression.  [facility] [address] has a fairly good selection.  Just look for the open toe choice.  Put the stockings on first thing in the morning and then remove before bedtime.   Leg cramps-we will check for electrolyte disturbance as well as magnesium levels.  Work on gentle stretches and make sure hydrating really well during the daytime.  I do think compression stockings will help as well.

## 2017-04-13 ENCOUNTER — Encounter: Payer: Self-pay | Admitting: Family Medicine

## 2017-04-13 ENCOUNTER — Ambulatory Visit (INDEPENDENT_AMBULATORY_CARE_PROVIDER_SITE_OTHER): Payer: Medicare HMO | Admitting: Family Medicine

## 2017-04-13 VITALS — BP 146/81 | HR 60 | Wt 216.0 lb

## 2017-04-13 DIAGNOSIS — I83893 Varicose veins of bilateral lower extremities with other complications: Secondary | ICD-10-CM

## 2017-04-13 DIAGNOSIS — R252 Cramp and spasm: Secondary | ICD-10-CM

## 2017-04-13 DIAGNOSIS — I878 Other specified disorders of veins: Secondary | ICD-10-CM

## 2017-04-13 DIAGNOSIS — Z125 Encounter for screening for malignant neoplasm of prostate: Secondary | ICD-10-CM

## 2017-04-13 NOTE — Patient Instructions (Addendum)
Recommend a trial of compression stockings at least above the knee.  Might even consider the thigh-high ones.  Recommend at least moderate compression.  [facility] here [address] has a fairly good selection.  Just look for the open toe choice.   Put the stockings on first thing in the morning and then remove before bedtime. Call with lab results once available. Really push hydration during the day.

## 2017-04-19 ENCOUNTER — Other Ambulatory Visit: Payer: Self-pay | Admitting: Family Medicine

## 2017-04-19 ENCOUNTER — Encounter: Payer: Self-pay | Admitting: Family Medicine

## 2017-04-19 DIAGNOSIS — Z1211 Encounter for screening for malignant neoplasm of colon: Secondary | ICD-10-CM

## 2017-04-20 ENCOUNTER — Telehealth: Payer: Self-pay | Admitting: Family Medicine

## 2017-04-20 NOTE — Telephone Encounter (Signed)
Please call Steven Ashley and let them know last colonoscopy was 2005, not 2010.  Sorry for the confusionon the referral.

## 2017-04-27 ENCOUNTER — Encounter: Payer: Self-pay | Admitting: Gastroenterology

## 2017-04-28 ENCOUNTER — Other Ambulatory Visit: Payer: Self-pay | Admitting: Family Medicine

## 2017-05-01 MED ORDER — SILODOSIN 4 MG PO CAPS: 4.0000 mg | ORAL_CAPSULE | Freq: Every day | ORAL | 0 refills | Status: DC

## 2017-05-10 DIAGNOSIS — Z125 Encounter for screening for malignant neoplasm of prostate: Secondary | ICD-10-CM | POA: Diagnosis not present

## 2017-05-10 DIAGNOSIS — R252 Cramp and spasm: Secondary | ICD-10-CM | POA: Diagnosis not present

## 2017-05-10 DIAGNOSIS — I878 Other specified disorders of veins: Secondary | ICD-10-CM | POA: Diagnosis not present

## 2017-05-10 DIAGNOSIS — I83893 Varicose veins of bilateral lower extremities with other complications: Secondary | ICD-10-CM | POA: Diagnosis not present

## 2017-05-10 LAB — COMPLETE METABOLIC PANEL WITH GFR
AG RATIO: 2.2 (calc) (ref 1.0–2.5)
ALT: 19 U/L (ref 9–46)
AST: 19 U/L (ref 10–35)
Albumin: 4.8 g/dL (ref 3.6–5.1)
Alkaline phosphatase (APISO): 66 U/L (ref 40–115)
BUN: 20 mg/dL (ref 7–25)
CALCIUM: 9.9 mg/dL (ref 8.6–10.3)
CO2: 28 mmol/L (ref 20–32)
Chloride: 103 mmol/L (ref 98–110)
Creat: 1.14 mg/dL (ref 0.70–1.25)
GFR, EST NON AFRICAN AMERICAN: 65 mL/min/{1.73_m2} (ref >=60)
GFR, Est African American: 76 mL/min/{1.73_m2} (ref >=60)
GLOBULIN: 2.2 g/dL (ref 1.9–3.7)
Glucose, Bld: 100 mg/dL — ABNORMAL HIGH (ref 65–99)
POTASSIUM: 4.6 mmol/L (ref 3.5–5.3)
SODIUM: 139 mmol/L (ref 135–146)
Total Bilirubin: 0.4 mg/dL (ref 0.2–1.2)
Total Protein: 7 g/dL (ref 6.1–8.1)

## 2017-05-10 LAB — CBC
HEMATOCRIT: 46.3 % (ref 38.5–50.0)
HEMOGLOBIN: 15.8 g/dL (ref 13.2–17.1)
MCH: 30.1 pg (ref 27.0–33.0)
MCHC: 34.1 g/dL (ref 32.0–36.0)
MCV: 88.2 fL (ref 80.0–100.0)
MPV: 10.3 fL (ref 7.5–12.5)
Platelets: 208 10*3/uL (ref 140–400)
RBC: 5.25 10*6/uL (ref 4.20–5.80)
RDW: 12.5 % (ref 11.0–15.0)
WBC: 7.2 10*3/uL (ref 3.8–10.8)

## 2017-05-10 LAB — TSH: TSH: 2.55 mIU/L (ref 0.40–4.50)

## 2017-05-10 LAB — PSA: PSA: 1.2 ng/mL (ref <=4.0)

## 2017-05-10 LAB — MAGNESIUM: Magnesium: 2.1 mg/dL (ref 1.5–2.5)

## 2017-05-12 MED ORDER — SILDENAFIL CITRATE 100 MG PO TABS: 100.0000 mg | ORAL_TABLET | Freq: Every day | ORAL | 5 refills | Status: DC | PRN

## 2017-05-12 NOTE — Addendum Note (Signed)
Addended by: Beatrice Lecher D on: 05/12/2017 10:19 AM   Modules accepted: Orders

## 2017-05-30 ENCOUNTER — Other Ambulatory Visit: Payer: Self-pay

## 2017-05-30 ENCOUNTER — Ambulatory Visit (AMBULATORY_SURGERY_CENTER): Payer: Self-pay

## 2017-05-30 VITALS — Ht 69.5 in | Wt 218.4 lb

## 2017-05-30 DIAGNOSIS — Z1211 Encounter for screening for malignant neoplasm of colon: Secondary | ICD-10-CM

## 2017-05-30 MED ORDER — NA SULFATE-K SULFATE-MG SULF 17.5-3.13-1.6 GM/177ML PO SOLN: 1.0000 | Freq: Once | ORAL | 0 refills | Status: AC

## 2017-05-30 NOTE — Progress Notes (Signed)
Denies allergies to eggs or soy products. Denies complication of anesthesia or sedation. Denies use of weight loss medication. Denies use of O2.   Emmi instructions declined.  

## 2017-06-13 ENCOUNTER — Ambulatory Visit (AMBULATORY_SURGERY_CENTER): Payer: Medicare HMO | Admitting: Gastroenterology

## 2017-06-13 ENCOUNTER — Encounter: Payer: Self-pay | Admitting: Gastroenterology

## 2017-06-13 ENCOUNTER — Other Ambulatory Visit: Payer: Self-pay

## 2017-06-13 VITALS — BP 129/70 | HR 52 | Temp 98.6°F | Resp 11 | Ht 69.5 in | Wt 218.0 lb

## 2017-06-13 DIAGNOSIS — K635 Polyp of colon: Secondary | ICD-10-CM | POA: Diagnosis not present

## 2017-06-13 DIAGNOSIS — Z1211 Encounter for screening for malignant neoplasm of colon: Secondary | ICD-10-CM

## 2017-06-13 DIAGNOSIS — K5730 Diverticulosis of large intestine without perforation or abscess without bleeding: Secondary | ICD-10-CM

## 2017-06-13 DIAGNOSIS — Z1212 Encounter for screening for malignant neoplasm of rectum: Secondary | ICD-10-CM

## 2017-06-13 DIAGNOSIS — D125 Benign neoplasm of sigmoid colon: Secondary | ICD-10-CM

## 2017-06-13 MED ORDER — SODIUM CHLORIDE 0.9 % IV SOLN: 500.0000 mL | Freq: Once | INTRAVENOUS | Status: DC

## 2017-06-13 NOTE — Progress Notes (Signed)
Called to room to assist during endoscopic procedure.  Patient ID and intended procedure confirmed with present staff. Received instructions for my participation in the procedure from the performing physician.  

## 2017-06-13 NOTE — Op Note (Signed)
[facility] Patient Name: Steven Ashley Procedure Date: 06/13/2017 8:44 AM MRN: [id number] Endoscopist: Milus Banister , MD Age: 67 Referring MD:  Date of Birth: July 25, 1950 Gender: Male Account #: [id number] Procedure:                Colonoscopy Indications:              Screening for colorectal malignant neoplasm;                            colonoscopy 14 years ago in [place]:                Monitored Anesthesia Care Procedure:                Pre-Anesthesia Assessment:                           - Prior to the procedure, a History and Physical                            was performed, and patient medications and                            allergies were reviewed. The patient's tolerance of                            previous anesthesia was also reviewed. The risks                            and benefits of the procedure and the sedation                            options and risks were discussed with the patient.                            All questions were answered, and informed consent                            was obtained. Prior Anticoagulants: The patient has                            taken no previous anticoagulant or antiplatelet                            agents. ASA Grade Assessment: II - A patient with                            mild systemic disease. After reviewing the risks                            and benefits, the patient was deemed in                            satisfactory condition to undergo the procedure.  After obtaining informed consent, the colonoscope                            was passed under direct vision. Throughout the                            procedure, the patient's blood pressure, pulse, and                            oxygen saturations were monitored continuously. The                            Colonoscope was introduced through the anus and                            advanced to the the cecum,  identified by                            appendiceal orifice and ileocecal valve. The                            colonoscopy was performed without difficulty. The                            patient tolerated the procedure well. The quality                            of the bowel preparation was good. The ileocecal                            valve, appendiceal orifice, and rectum were                            photographed. Scope In: 8:53:30 AM Scope Out: 9:05:30 AM Scope Withdrawal Time: 0 hours 9 minutes 18 seconds  Total Procedure Duration: 0 hours 12 minutes 0 seconds  Findings:                 A 3 mm polyp was found in the sigmoid colon. The                            polyp was sessile. The polyp was removed with a                            cold snare. Resection and retrieval were complete.                           A few diverticula were found in the left colon.                           The exam was otherwise without abnormality on                            direct and retroflexion views. Complications:  No immediate complications. Estimated blood loss:                            None. Estimated Blood Loss:     Estimated blood loss: none. Impression:               - One 3 mm polyp in the sigmoid colon, removed with                            a cold snare. Resected and retrieved.                           - Diverticulosis in the left colon.                           - The examination was otherwise normal on direct                            and retroflexion views. Recommendation:           - Patient has a contact number available for                            emergencies. The signs and symptoms of potential                            delayed complications were discussed with the                            patient. Return to normal activities tomorrow.                            Written discharge instructions were provided to the                            patient.                            - Resume previous diet.                           - Continue present medications.                           You will receive a letter within 2-3 weeks with the                            pathology results and my final recommendations.                           If the polyp(s) is proven to be 'pre-cancerous' on                            pathology, you will need repeat colonoscopy in 5  years. If the polyp(s) is NOT 'precancerous' on                            pathology then you should repeat colon cancer                            screening in 10 years with colonoscopy without need                            for colon cancer screening by any method prior to                            then (including stool testing). Milus Banister, MD 06/13/2017 9:07:50 AM This report has been signed electronically.

## 2017-06-13 NOTE — Progress Notes (Signed)
Spontaneous respirations throughout. VSS. Resting comfortably. To PACU on room air. Report to  RN. 

## 2017-06-13 NOTE — Patient Instructions (Signed)
YOU HAD AN ENDOSCOPIC PROCEDURE TODAY AT THE  ENDOSCOPY CENTER:   Refer to the procedure report that was given to you for any specific questions about what was found during the examination.  If the procedure report does not answer your questions, please call your gastroenterologist to clarify.  If you requested that your care partner not be given the details of your procedure findings, then the procedure report has been included in a sealed envelope for you to review at your convenience later.  YOU SHOULD EXPECT: Some feelings of bloating in the abdomen. Passage of more gas than usual.  Walking can help get rid of the air that was put into your GI tract during the procedure and reduce the bloating. If you had a lower endoscopy (such as a colonoscopy or flexible sigmoidoscopy) you may notice spotting of blood in your stool or on the toilet paper. If you underwent a bowel prep for your procedure, you may not have a normal bowel movement for a few days.  Please Note:  You might notice some irritation and congestion in your nose or some drainage.  This is from the oxygen used during your procedure.  There is no need for concern and it should clear up in a day or so.  SYMPTOMS TO REPORT IMMEDIATELY:   Following lower endoscopy (colonoscopy or flexible sigmoidoscopy):  Excessive amounts of blood in the stool  Significant tenderness or worsening of abdominal pains  Swelling of the abdomen that is new, acute  Fever of 100F or higher    For urgent or emergent issues, a gastroenterologist can be reached at any hour by calling [phone].   DIET:  We do recommend a small meal at first, but then you may proceed to your regular diet.  Drink plenty of fluids but you should avoid alcoholic beverages for 24 hours.  ACTIVITY:  You should plan to take it easy for the rest of today and you should NOT DRIVE or use heavy machinery until tomorrow (because of the sedation medicines used during the test).     FOLLOW UP: Our staff will call the number listed on your records the next business day following your procedure to check on you and address any questions or concerns that you may have regarding the information given to you following your procedure. If we do not reach you, we will leave a message.  However, if you are feeling well and you are not experiencing any problems, there is no need to return our call.  We will assume that you have returned to your regular daily activities without incident.  If any biopsies were taken you will be contacted by phone or by letter within the next 1-3 weeks.  Please call us at [phone] if you have not heard about the biopsies in 3 weeks.    SIGNATURES/CONFIDENTIALITY: You and/or your care partner have signed paperwork which will be entered into your electronic medical record.  These signatures attest to the fact that that the information above on your After Visit Summary has been reviewed and is understood.  Full responsibility of the confidentiality of this discharge information lies with you and/or your care-partner.   Resume medications. Information given on polyps and diverticulosis. 

## 2017-06-14 ENCOUNTER — Telehealth: Payer: Self-pay

## 2017-06-14 NOTE — Telephone Encounter (Signed)
  Follow up Call-  Call back number 06/13/2017  Post procedure Call Back phone  # [phone]  Permission to leave phone message Yes  Some recent data might be hidden     Left message

## 2017-06-14 NOTE — Telephone Encounter (Signed)
Patient states he is doing great today after procedure.

## 2017-06-14 NOTE — Telephone Encounter (Signed)
NO ANSWER, MESSAGE LEFT FOR PATIENT. 

## 2017-06-16 ENCOUNTER — Encounter: Payer: Self-pay | Admitting: Gastroenterology

## 2017-09-11 DIAGNOSIS — H52223 Regular astigmatism, bilateral: Secondary | ICD-10-CM | POA: Diagnosis not present

## 2017-09-11 DIAGNOSIS — H2513 Age-related nuclear cataract, bilateral: Secondary | ICD-10-CM | POA: Diagnosis not present

## 2017-10-04 DIAGNOSIS — L80 Vitiligo: Secondary | ICD-10-CM | POA: Diagnosis not present

## 2017-10-04 DIAGNOSIS — D225 Melanocytic nevi of trunk: Secondary | ICD-10-CM | POA: Diagnosis not present

## 2017-10-04 DIAGNOSIS — L821 Other seborrheic keratosis: Secondary | ICD-10-CM | POA: Diagnosis not present

## 2018-02-17 IMAGING — DX DG SHOULDER 2+V*R*
3 series · 3 of 3 positions shown · non-contrast
Comparison: None in PACs

CLINICAL DATA: Neck and right shoulder pain for several months
without known injury.

EXAM:
RIGHT SHOULDER - 2+ VIEW

[shoulder grashey]
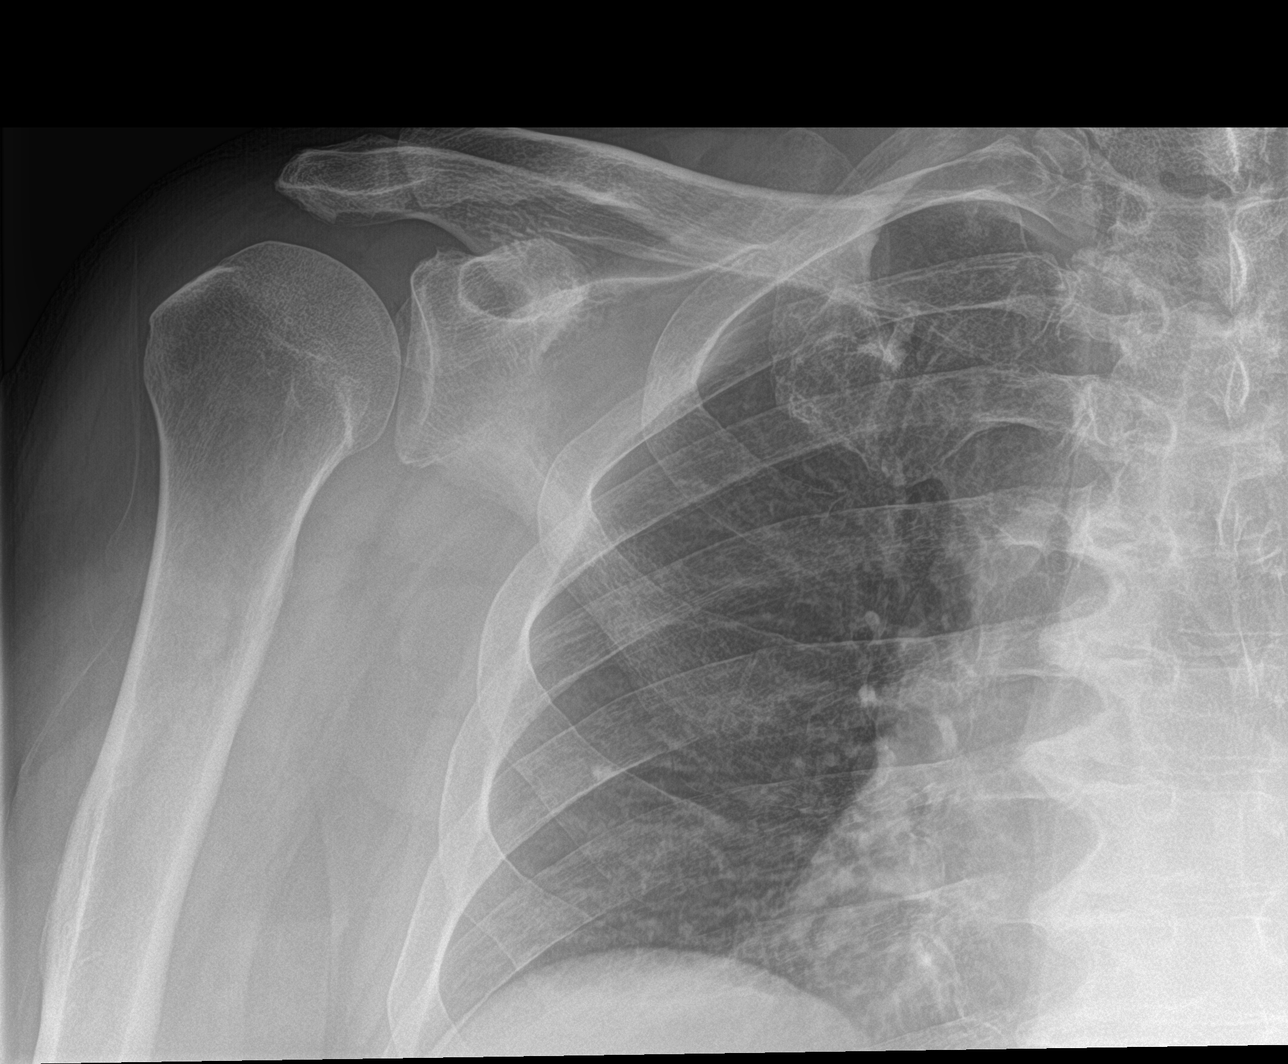

[shoulder y view]
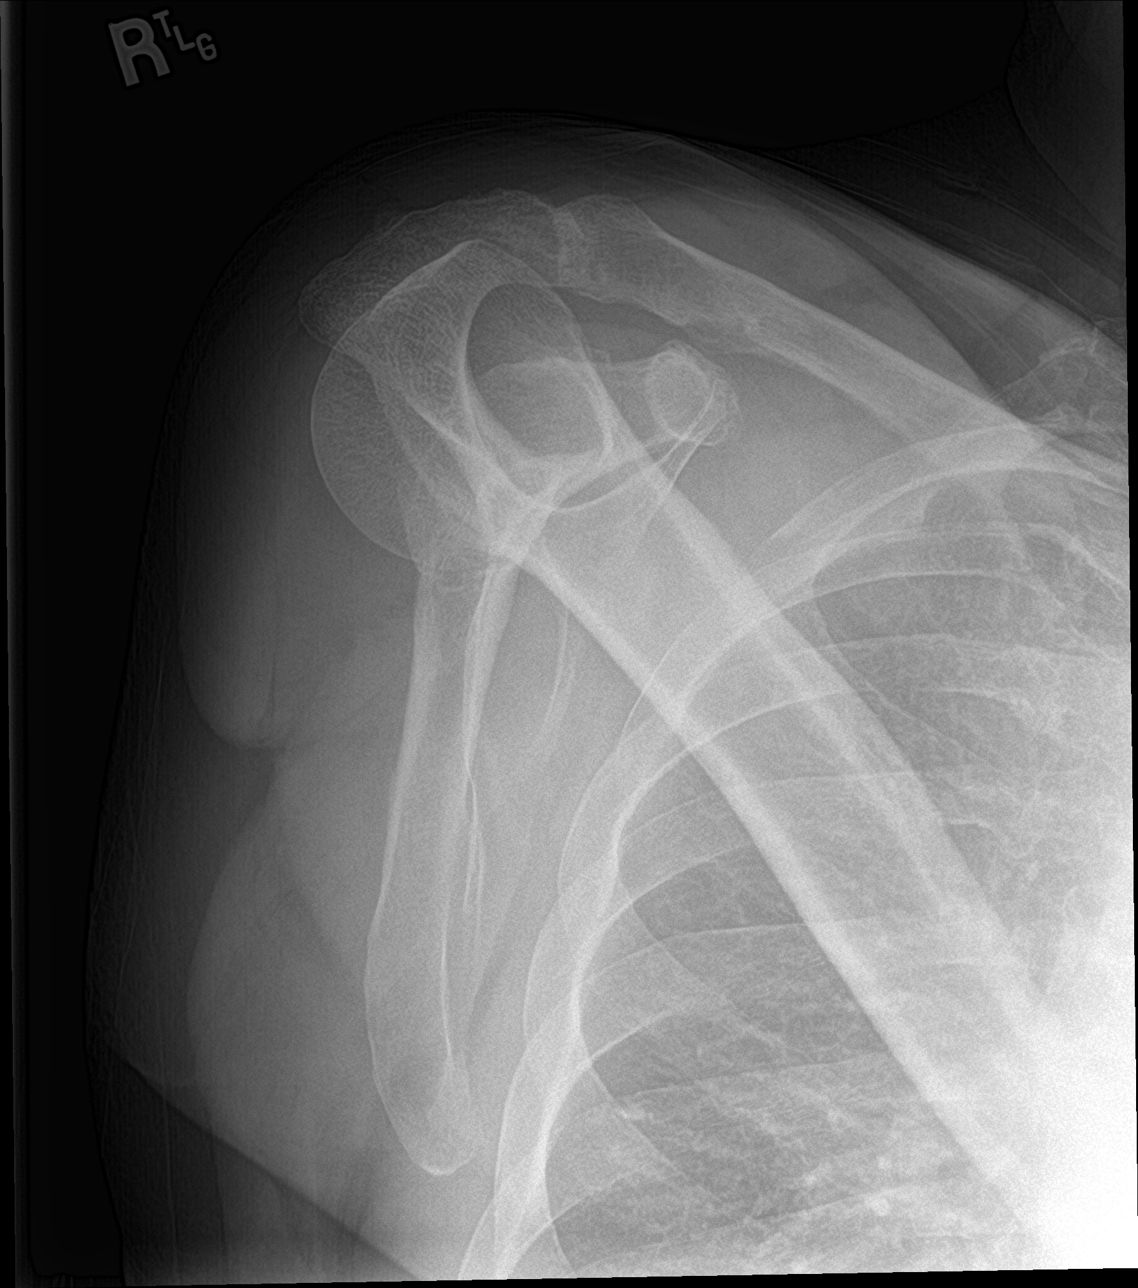

[shoulder axillary]
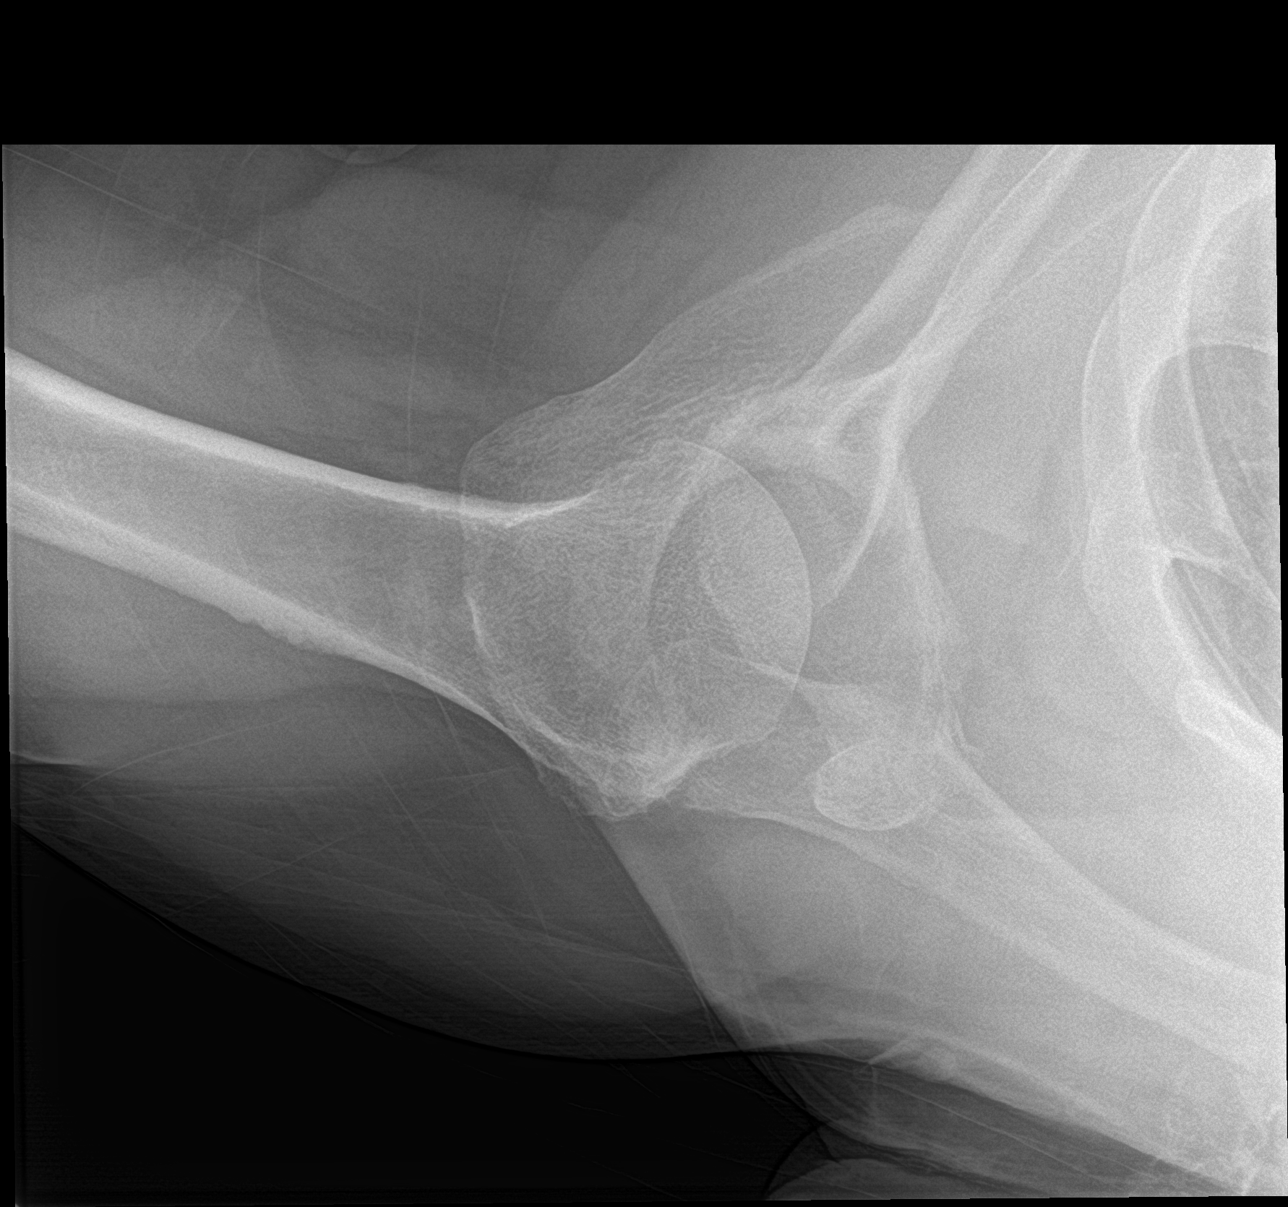

[3 of 3 positions shown; findings below may reference images not displayed]

FINDINGS: The bones are subjectively adequately mineralized. The glenohumeral
joint space is preserved. The articular surfaces of the humeral head
and glenoid appear normal. There is a small subacromial spur. The AC
joint space is reasonably well-maintained. The observed portions of
the right clavicle and upper right ribs are normal.
IMPRESSION: Small subacromial spur. No significant bony abnormality of the right
shoulder otherwise.

## 2021-10-05 ENCOUNTER — Encounter: Payer: Self-pay | Admitting: Family Medicine

## 2021-10-05 ENCOUNTER — Emergency Department (HOSPITAL_BASED_OUTPATIENT_CLINIC_OR_DEPARTMENT_OTHER)
Admission: EM | Admit: 2021-10-05 | Discharge: 2021-10-06 | Disposition: A | Discharge status: Home / Self Care | Payer: Medicare PPO | Attending: Emergency Medicine | Admitting: Emergency Medicine

## 2021-10-05 ENCOUNTER — Telehealth (HOSPITAL_BASED_OUTPATIENT_CLINIC_OR_DEPARTMENT_OTHER): Payer: Self-pay | Admitting: Nurse Practitioner

## 2021-10-05 ENCOUNTER — Other Ambulatory Visit: Payer: Self-pay

## 2021-10-05 ENCOUNTER — Encounter (HOSPITAL_BASED_OUTPATIENT_CLINIC_OR_DEPARTMENT_OTHER): Payer: Self-pay

## 2021-10-05 ENCOUNTER — Emergency Department (HOSPITAL_BASED_OUTPATIENT_CLINIC_OR_DEPARTMENT_OTHER): Payer: Medicare PPO

## 2021-10-05 ENCOUNTER — Ambulatory Visit (INDEPENDENT_AMBULATORY_CARE_PROVIDER_SITE_OTHER): Payer: Medicare PPO | Admitting: Family Medicine

## 2021-10-05 VITALS — BP 150/59 | HR 59 | Resp 18 | Ht 69.5 in | Wt 224.0 lb

## 2021-10-05 DIAGNOSIS — W1839XA Other fall on same level, initial encounter: Secondary | ICD-10-CM | POA: Diagnosis not present

## 2021-10-05 DIAGNOSIS — R791 Abnormal coagulation profile: Secondary | ICD-10-CM | POA: Diagnosis not present

## 2021-10-05 DIAGNOSIS — M79605 Pain in left leg: Secondary | ICD-10-CM | POA: Insufficient documentation

## 2021-10-05 DIAGNOSIS — M7989 Other specified soft tissue disorders: Secondary | ICD-10-CM | POA: Diagnosis not present

## 2021-10-05 DIAGNOSIS — S81802A Unspecified open wound, left lower leg, initial encounter: Secondary | ICD-10-CM

## 2021-10-05 DIAGNOSIS — T148XXA Other injury of unspecified body region, initial encounter: Secondary | ICD-10-CM

## 2021-10-05 DIAGNOSIS — S8992XA Unspecified injury of left lower leg, initial encounter: Secondary | ICD-10-CM | POA: Diagnosis present

## 2021-10-05 LAB — CBC WITH DIFFERENTIAL/PLATELET
Absolute Monocytes: 624 cells/uL (ref 200–950)
Basophils Absolute: 39 cells/uL (ref 0–200)
Basophils Relative: 0.5 %
Eosinophils Absolute: 169 cells/uL (ref 15–500)
Eosinophils Relative: 2.2 %
HCT: 39.2 % (ref 38.5–50.0)
Hemoglobin: 13.4 g/dL (ref 13.2–17.1)
Lymphs Abs: 1640 cells/uL (ref 850–3900)
MCH: 30.5 pg (ref 27.0–33.0)
MCHC: 34.2 g/dL (ref 32.0–36.0)
MCV: 89.1 fL (ref 80.0–100.0)
MPV: 10 fL (ref 7.5–12.5)
Monocytes Relative: 8.1 %
Neutro Abs: 5228 cells/uL (ref 1500–7800)
Neutrophils Relative %: 67.9 %
Platelets: 222 10*3/uL (ref 140–400)
RBC: 4.4 10*6/uL (ref 4.20–5.80)
RDW: 12.7 % (ref 11.0–15.0)
Total Lymphocyte: 21.3 %
WBC: 7.7 10*3/uL (ref 3.8–10.8)

## 2021-10-05 LAB — D-DIMER, QUANTITATIVE: D-Dimer, Quant: 1.44 mcg/mL FEU — ABNORMAL HIGH (ref <0.50)

## 2021-10-05 MED ORDER — SULFAMETHOXAZOLE-TRIMETHOPRIM 800-160 MG PO TABS: 1.0000 | ORAL_TABLET | Freq: Two times a day (BID) | ORAL | 0 refills | Status: DC

## 2021-10-05 NOTE — Telephone Encounter (Signed)
On Call Note  Contacted by nurse line with critical d-dimer of 1.44 for Steven Ashley. Review of chart shows recent injury to LLE with new/worsening edema and pain.   Contacted Steven Ashley by phone and spoke with him and his wife about the positive d-dimer result and recommendation for ultrasound of the lower extremity for further evaluation for DVT risk.   Patient and wife expressed understanding of the situation and recommendations. They plan to proceed to [facility] for evaluation this evening.   I will monitor for the results to come back and notify the patient once these have been received.

## 2021-10-05 NOTE — Progress Notes (Signed)
Hi Jim, great news!  The blood count looks good.  No sign of systemic infection which is very reassuring.  So like to keep an eye on your blood pressure and maybe recheck that again with a nurse visit once you are back from traveling.

## 2021-10-05 NOTE — Progress Notes (Signed)
Established Patient Office Visit  Subjective   Patient ID: Steven Ashley, male    DOB: 10-21-1950  Age: 71 y.o. MRN: [id number]  Chief Complaint  Patient presents with   Hospital Follow up     Fall, Injury/Bruise on left side of body . Patient states left leg is still painful/bruised with no improvement. Patient is now having shooting pains in leg and left big toe.     HPI  F/U recent ED visit s/p fall on 09/26/2021.  Going down stairs with furniture and tripped injury to left rib cage, left lower leg, right back, and hit his head. Abrasion to left shin, swelling to shin noted. Fall, Injury/Bruise on left side of body . Patient states left leg is still painful/bruised with no improvement. Patient is now having shooting pains in leg and left big toe. He I son clindamycin.   Just completed antibiotics yesterday.  He is concerned because he still having significant pain.  In fact he feels like it is more painful now than it was a week ago when it occurred he still having a lot of significant swelling.  He was icing it periodically and using antibiotic ointment on it.  Denies any fever or chills.  No active drainage from the wound.     ROS    Objective:     BP (!) 150/59   Pulse (!) 59   Resp 18   Ht 5' 9.5" (1.765 m)   Wt 224 lb (101.6 kg)   SpO2 94%   BMI 32.60 kg/m     Physical Exam Vitals reviewed.  Constitutional:      Appearance: He is well-developed.  HENT:     Head: Normocephalic and atraumatic.  Eyes:     Conjunctiva/sclera: Conjunctivae normal.  Cardiovascular:     Rate and Rhythm: Normal rate.  Pulmonary:     Effort: Pulmonary effort is normal.  Musculoskeletal:     Comments: Lower leg with swelling from the knee down to the foot.  He has significant bruising and erythema over the anterior shin.  There is an open horizontal wound.  No active drainage it looks clean.  Skin:    General: Skin is dry.     Coloration: Skin is not pale.  Neurological:     Mental  Status: He is alert and oriented to person, place, and time.  Psychiatric:        Behavior: Behavior normal.      Results for orders placed or performed in visit on 10/05/21  CBC with Differential/Platelet  Result Value Ref Range   WBC 7.7 3.8 - 10.8 Thousand/uL   RBC 4.40 4.20 - 5.80 Million/uL   Hemoglobin 13.4 13.2 - 17.1 g/dL   HCT 39.2 38.5 - 50.0 %   MCV 89.1 80.0 - 100.0 fL   MCH 30.5 27.0 - 33.0 pg   MCHC 34.2 32.0 - 36.0 g/dL   RDW 12.7 11.0 - 15.0 %   Platelets 222 140 - 400 Thousand/uL   MPV 10.0 7.5 - 12.5 fL   Neutro Abs 5,228 1,500 - 7,800 cells/uL   Lymphs Abs 1,640 850 - 3,900 cells/uL   Absolute Monocytes 624 200 - 950 cells/uL   Eosinophils Absolute 169 15 - 500 cells/uL   Basophils Absolute 39 0 - 200 cells/uL   Neutrophils Relative % 67.9 %   Total Lymphocyte 21.3 %   Monocytes Relative 8.1 %   Eosinophils Relative 2.2 %   Basophils Relative 0.5 %  The ASCVD Risk score (Arnett DK, et al., 2019) failed to calculate for the following reasons:   Cannot find a previous HDL lab   Cannot find a previous total cholesterol lab    Assessment & Plan:   Problem List Items Addressed This Visit   None Visit Diagnoses     Wound of left lower extremity, initial encounter    -  Primary   Relevant Orders   CBC with Differential/Platelet (Completed)   D-Dimer, Quantitative   Left leg swelling       Relevant Orders   CBC with Differential/Platelet (Completed)   D-Dimer, Quantitative      Wound of left lower extremity-the wound itself actually looks good no active drainage.  But he has significant erythema and swelling over the distal lower leg from the knee to the ankle.  Recommend Ace wrap for compression.  Wrapped midfoot to just below the knee.  We did apply an Ace wrap here today in the office we also applied a small amount of Xeroform to the wounds of the Ace wrap would not stick to it.  The swelling actually goes into his foot but the area of  erythema stops above the ankle.Avoid using antibiotic ointment on the wound.  Okay to use petroleum jelly. Wear the compression stocking daily for the next week if possible.  Try to elevate for 15 to 20 minutes 3 times a day.  Continue to ice 2-3 times a day for the next 5 days if possible.  He is leaving town later this week.    Meds ordered this encounter  Medications   sulfamethoxazole-trimethoprim (BACTRIM DS) 800-160 MG tablet    Sig: Take 1 tablet by mouth 2 (two) times daily.    Dispense:  14 tablet    Refill:  0     Return if symptoms worsen or fail to improve.    Beatrice Lecher, MD

## 2021-10-05 NOTE — ED Provider Notes (Signed)
[facility] Provider Note   CSN: [id number] Arrival date & time: 10/05/21  1953     History  Chief Complaint  Patient presents with   Leg Swelling    Steven Ashley is a 71 y.o. male.  71 yo M with a chief complaints of left leg pain and swelling.  He fell on a [employer] ramp about 10 days ago.  Has had some persistent pain and swelling despite trying to elevate it and taking over-the-counter medications.  Saw his family doctor today who had obtained a D-dimer and then sent him here for evaluation.        Home Medications Prior to Admission medications   Medication Sig Start Date End Date Taking? Authorizing Provider  cholecalciferol (VITAMIN D3) 25 MCG (1000 UNIT) tablet Take 1,000 Units by mouth daily.    [provider]  clindamycin (CLEOCIN) 300 MG capsule Take 300 mg by mouth 3 (three) times daily. 09/27/21   [provider]  Magnesium 100 MG TABS Take by mouth.    [provider]  Multiple Vitamin (MULTIVITAMIN WITH MINERALS) TABS Take 1 tablet by mouth daily. [place]    [provider]  saw palmetto 160 MG capsule Take 160 mg by mouth daily.    [provider]  sildenafil (VIAGRA) 100 MG tablet Take 1 tablet (100 mg total) by mouth daily as needed for erectile dysfunction. 05/12/17   Hali Marry, MD  sulfamethoxazole-trimethoprim (BACTRIM DS) 800-160 MG tablet Take 1 tablet by mouth 2 (two) times daily. 10/05/21   Hali Marry, MD  Turmeric 1053 MG TABS Take by mouth.    [provider]  vitamin B-12 (CYANOCOBALAMIN) 500 MCG tablet Take 500 mcg by mouth daily.    [provider]      Allergies    Tetracycline    Review of Systems   Review of Systems  Physical Exam Updated Vital Signs BP (!) 156/66 (BP Location: Right Arm)   Pulse (!) 58   Temp 98.4 F (36.9 C)   Resp 18   Ht 5' 9.5" (1.765 m)   Wt 101.6 kg   SpO2 98%   BMI 32.60 kg/m  Physical  Exam Vitals and nursing note reviewed.  Constitutional:      Appearance: He is well-developed.  HENT:     Head: Normocephalic and atraumatic.  Eyes:     Pupils: Pupils are equal, round, and reactive to light.  Neck:     Vascular: No JVD.  Cardiovascular:     Rate and Rhythm: Normal rate and regular rhythm.     Heart sounds: No murmur heard.    No friction rub. No gallop.  Pulmonary:     Effort: No respiratory distress.     Breath sounds: No wheezing.  Abdominal:     General: There is no distension.     Tenderness: There is no abdominal tenderness. There is no guarding or rebound.  Musculoskeletal:        General: Normal range of motion.     Cervical back: Normal range of motion and neck supple.     Comments: Small laceration about the anterior aspect of the leg.  Ecchymosis surrounding.  Pulse motor and sensation intact distally.  Skin:    Coloration: Skin is not pale.     Findings: No rash.  Neurological:     Mental Status: He is alert and oriented to person, place, and time.  Psychiatric:  Behavior: Behavior normal.     ED Results / Procedures / Treatments   Labs (all labs ordered are listed, but only abnormal results are displayed) Labs Reviewed - No data to display  EKG None  Radiology US Venous Img Lower Unilateral Left  Result Date: 10/05/2021 CLINICAL DATA:  Left leg bruising and swelling EXAM: LEFT LOWER EXTREMITY VENOUS DOPPLER ULTRASOUND TECHNIQUE: Gray-scale sonography with compression, as well as color and duplex ultrasound, were performed to evaluate the deep venous system(s) from the level of the common femoral vein through the popliteal and proximal calf veins. COMPARISON:  None Available. FINDINGS: VENOUS Normal compressibility of the common femoral, superficial femoral, and popliteal veins, as well as the visualized calf veins. Visualized portions of profunda femoral vein and great saphenous vein unremarkable. No filling defects to suggest DVT on  grayscale or color Doppler imaging. Doppler waveforms show normal direction of venous flow, normal respiratory plasticity and response to augmentation. Limited views of the contralateral common femoral vein are unremarkable. OTHER None. Limitations: none IMPRESSION: Negative. Electronically Signed   By: Fidela Salisbury M.D.   On: 10/05/2021 21:35    Procedures Procedures    Medications Ordered in ED Medications - No data to display  ED Course/ Medical Decision Making/ A&P                           Medical Decision Making  71 yo M with a chief complaints of left leg pain and swelling.  This is after he struck it against a [employer] ramp about 10 days ago.  He saw his family doctor today who had a D-dimer which was elevated and then sent him here for evaluation.  Clinically the patient has a hematoma.  Wound does not appear to be infected.  His DVT study here is negative.  We will have him follow-up with his PCP.  11:54 PM:  I have discussed the diagnosis/risks/treatment options with the patient and family.  Evaluation and diagnostic testing in the emergency department does not suggest an emergent condition requiring admission or immediate intervention beyond what has been performed at this time.  They will follow up with  PCP. We also discussed returning to the ED immediately if new or worsening sx occur. We discussed the sx which are most concerning (e.g., sudden worsening pain, fever, inability to tolerate by mouth, redness, drainage) that necessitate immediate return. Medications administered to the patient during their visit and any new prescriptions provided to the patient are listed below.  Medications given during this visit Medications - No data to display   The patient appears reasonably screen and/or stabilized for discharge and I doubt any other medical condition or other [facility] requiring further screening, evaluation, or treatment in the ED at this time prior to discharge.          Final Clinical Impression(s) / ED Diagnoses Final diagnoses:  Leg wound, left, initial encounter  Hematoma    Rx / DC Orders ED Discharge Orders     None         Deno Etienne, DO 10/05/21 2354

## 2021-10-05 NOTE — ED Triage Notes (Signed)
Patient here POV from Home.  Endorses falling on [employer] Ramp approximately 10 days PTA. Endorses Swelling and Pain to Left Leg. Positive D-Dimer with PCP today and sent for ED Evaluation and US Imaging.   NAD Noted during Triage. A&Ox4. GCS 15. Ambulatory but Painful.

## 2021-10-05 NOTE — Discharge Instructions (Signed)
Seek emergency care for redness drainage or fever. try to keep the leg elevated whenever you are not doing something.  Enjoy your trip.  If your swelling continues then please let your family doctor know in about a week they may consider repeat ultrasound.

## 2021-10-05 NOTE — Patient Instructions (Signed)
Avoid using antibiotic ointment on the wound.  Okay to use petroleum jelly. Wear the compression stocking daily for the next week if possible.  Try to elevate for 15 to 20 minutes 3 times a day.  Continue to ice 2-3 times a day for the next 5 days if possible.

## 2021-10-06 NOTE — ED Notes (Signed)
Patient verbalizes understanding of discharge instructions. Opportunity for questioning and answers were provided. Armband removed by staff, pt discharged from ED. Ambulated out to lobby  

## 2021-12-15 ENCOUNTER — Encounter: Payer: Self-pay | Admitting: General Practice

## 2021-12-30 ENCOUNTER — Encounter: Payer: Self-pay | Admitting: Family Medicine

## 2021-12-30 ENCOUNTER — Ambulatory Visit (INDEPENDENT_AMBULATORY_CARE_PROVIDER_SITE_OTHER): Payer: Medicare HMO | Admitting: Family Medicine

## 2021-12-30 VITALS — BP 150/82 | HR 53 | Wt 223.0 lb

## 2021-12-30 DIAGNOSIS — M545 Low back pain, unspecified: Secondary | ICD-10-CM | POA: Diagnosis not present

## 2021-12-30 DIAGNOSIS — G8929 Other chronic pain: Secondary | ICD-10-CM

## 2021-12-30 DIAGNOSIS — L409 Psoriasis, unspecified: Secondary | ICD-10-CM | POA: Diagnosis not present

## 2021-12-30 DIAGNOSIS — Z23 Encounter for immunization: Secondary | ICD-10-CM

## 2021-12-30 DIAGNOSIS — R350 Frequency of micturition: Secondary | ICD-10-CM

## 2021-12-30 DIAGNOSIS — R351 Nocturia: Secondary | ICD-10-CM | POA: Diagnosis not present

## 2021-12-30 DIAGNOSIS — N401 Enlarged prostate with lower urinary tract symptoms: Secondary | ICD-10-CM | POA: Diagnosis not present

## 2021-12-30 DIAGNOSIS — R21 Rash and other nonspecific skin eruption: Secondary | ICD-10-CM | POA: Diagnosis not present

## 2021-12-30 DIAGNOSIS — L80 Vitiligo: Secondary | ICD-10-CM | POA: Diagnosis not present

## 2021-12-30 DIAGNOSIS — R7989 Other specified abnormal findings of blood chemistry: Secondary | ICD-10-CM

## 2021-12-30 LAB — POCT URINALYSIS DIP (CLINITEK)
Bilirubin, UA: NEGATIVE
Blood, UA: NEGATIVE
Glucose, UA: NEGATIVE mg/dL
Ketones, POC UA: NEGATIVE mg/dL
Leukocytes, UA: NEGATIVE
Nitrite, UA: NEGATIVE
POC PROTEIN,UA: 30 — AB
Spec Grav, UA: 1.02 (ref 1.010–1.025)
Urobilinogen, UA: 0.2 E.U./dL
pH, UA: 6 (ref 5.0–8.0)

## 2021-12-30 MED ORDER — CLOBETASOL PROPIONATE 0.05 % EX SOLN: 1.0000 | Freq: Two times a day (BID) | CUTANEOUS | 0 refills | Status: AC

## 2021-12-30 NOTE — Assessment & Plan Note (Addendum)
We did discuss that there is no specific treatment.  Avoiding sun to decrease the contrast between the tan skin and the hypopigmented skin is helpful.  There are some creams that conservatively in the skin a little bit but it does not bring the pigmentation back.

## 2021-12-30 NOTE — Progress Notes (Addendum)
Acute Office Visit  Subjective:     Patient ID: Steven Ashley, male    DOB: 08-08-1950, 71 y.o.   MRN: [id number]  Chief Complaint  Patient presents with   Leg Pain    Bilateral leg pain and back pain after injury and skin issues.    HPI Patient is in today for pain in legs after a fall  Itchy rash on scalp.  He says he will get little bumps on the scalp and then they get kind of crusty and itchy.  He was given a topical steroid liquid to use in the past that was helpful he was not sure if it was from me or his provider in [place].  He also recently bought a new shampoo and conditioner and soon as that seems to be helping as well.  The bumps and scales seem to get more inflamed when he gets really hot outside and gets sweaty.  He also wanted to discuss the vitiligo on his face.  He notices he is getting a few more areas that are affected and wanted to know what he can do to reduce this.  Let me know he had a second fall he is he was coming down his back stairs on his new house and suddenly fell forward and landed in the grass on the side of his face he says he really does not remember hurting himself but just wanted to mention it he does not think he passed out.  He has had some intermittent right low back pain just above the hip and going towards the spine.  He says occasionally if he is bending over a lot picking things up it will almost get tight or lock when he tries to stand back up straight.  ROS      Objective:    BP (!) 150/82   Pulse (!) 53   Wt 223 lb (101.2 kg)   SpO2 98%   BMI 32.46 kg/m    Physical Exam Constitutional:      Appearance: He is well-developed.  HENT:     Head: Normocephalic and atraumatic.  Cardiovascular:     Rate and Rhythm: Normal rate and regular rhythm.     Heart sounds: Normal heart sounds.  Pulmonary:     Effort: Pulmonary effort is normal.     Breath sounds: Normal breath sounds.  Skin:    General: Skin is warm and dry.   Neurological:     Mental Status: He is alert and oriented to person, place, and time.  Psychiatric:        Behavior: Behavior normal.     Results for orders placed or performed in visit on 12/30/21  POCT URINALYSIS DIP (CLINITEK)  Result Value Ref Range   Color, UA yellow yellow   Clarity, UA clear clear   Glucose, UA negative negative mg/dL   Bilirubin, UA negative negative   Ketones, POC UA negative negative mg/dL   Spec Grav, UA 1.020 1.010 - 1.025   Blood, UA negative negative   pH, UA 6.0 5.0 - 8.0   POC PROTEIN,UA =30 (A) negative, trace   Urobilinogen, UA 0.2 0.2 or 1.0 E.U./dL   Nitrite, UA Negative Negative   Leukocytes, UA Negative Negative        Assessment & Plan:   Problem List Items Addressed This Visit       Musculoskeletal and Integument   Vitiligo    We did discuss that there is no specific treatment.  Avoiding sun to decrease the contrast between the tan skin and the hypopigmented skin is helpful.  There are some creams that conservatively in the skin a little bit but it does not bring the pigmentation back.      Relevant Orders   PSA   Lipid Panel w/reflex Direct LDL   COMPLETE METABOLIC PANEL WITH GFR   CBC   Rash and nonspecific skin eruption    We will go ahead and send over a new prescription for topical steroid cream to use as needed it sounds like the new shampoo and conditioner have been helpful as well.  Not have any specific lesions on exam today for me to examine but I suspect it may either be seborrheic dermatitis or possibly some mild psoriasis.        Genitourinary   BPH (benign prostatic hyperplasia)    AUA score of 23 today.  Check PSA as well as do a urinalysis.  Everything looks reassuring and we could consider medication for treatment including Flomax.      Relevant Orders   PSA   Lipid Panel w/reflex Direct LDL   COMPLETE METABOLIC PANEL WITH GFR   CBC   POCT URINALYSIS DIP (CLINITEK) (Completed)     Other   Chronic  right-sided low back pain without sciatica   Other Visit Diagnoses     Psoriasis    -  Primary   Relevant Medications   clobetasol (TEMOVATE) 0.05 % external solution   Other Relevant Orders   PSA   Lipid Panel w/reflex Direct LDL   COMPLETE METABOLIC PANEL WITH GFR   CBC   Need for pneumococcal 20-valent conjugate vaccination       Relevant Orders   Pneumococcal conjugate vaccine 20-valent (Prevnar 20) (Completed)   Urine frequency       Relevant Orders   POCT URINALYSIS DIP (CLINITEK) (Completed)      Prenvar 20  given today.  Intermittent right low back pain-discussed that it is most consistent with a disc issue it does come and go.  We did discuss that exercises can be very helpful for this and avoiding bending at the spine and using his thighs and hips and legs to bend and reach.  Meds ordered this encounter  Medications   clobetasol (TEMOVATE) 0.05 % external solution    Sig: Apply 1 Application topically 2 (two) times daily.    Dispense:  50 mL    Refill:  0    RO-AUA SYMPTOM     Row Name 12/30/21 0800         During the last Month   Sensation of Bladder not Empty More than half the time     Urinate<2 hours after last More than half the time     Mult. stop/start when voiding Less than 1 time in 5     Difficult to postpone voiding About half the time     Weak urinary stream Almost Always     Push/strain to begin urination Less than 1 time in 5     Times per night up to urinate Almost Always       OTHER   Total Score 23               No follow-ups on file.  Beatrice Lecher, MD

## 2021-12-30 NOTE — Assessment & Plan Note (Signed)
We will go ahead and send over a new prescription for topical steroid cream to use as needed it sounds like the new shampoo and conditioner have been helpful as well.  Not have any specific lesions on exam today for me to examine but I suspect it may either be seborrheic dermatitis or possibly some mild psoriasis.

## 2021-12-30 NOTE — Assessment & Plan Note (Signed)
AUA score of 23 today.  Check PSA as well as do a urinalysis.  Everything looks reassuring and we could consider medication for treatment including Flomax.

## 2021-12-31 ENCOUNTER — Other Ambulatory Visit: Payer: Self-pay

## 2021-12-31 DIAGNOSIS — N289 Disorder of kidney and ureter, unspecified: Secondary | ICD-10-CM

## 2021-12-31 LAB — COMPLETE METABOLIC PANEL WITH GFR
AG Ratio: 2.3 (calc) (ref 1.0–2.5)
ALT: 18 U/L (ref 9–46)
AST: 17 U/L (ref 10–35)
Albumin: 4.6 g/dL (ref 3.6–5.1)
Alkaline phosphatase (APISO): 56 U/L (ref 35–144)
BUN/Creatinine Ratio: 18 (calc) (ref 6–22)
BUN: 24 mg/dL (ref 7–25)
CO2: 29 mmol/L (ref 20–32)
Calcium: 9.8 mg/dL (ref 8.6–10.3)
Chloride: 103 mmol/L (ref 98–110)
Creat: 1.32 mg/dL — ABNORMAL HIGH (ref 0.70–1.28)
Globulin: 2 g/dL (calc) (ref 1.9–3.7)
Glucose, Bld: 98 mg/dL (ref 65–99)
Potassium: 4.8 mmol/L (ref 3.5–5.3)
Sodium: 140 mmol/L (ref 135–146)
Total Bilirubin: 0.6 mg/dL (ref 0.2–1.2)
Total Protein: 6.6 g/dL (ref 6.1–8.1)
eGFR: 57 mL/min/{1.73_m2} — ABNORMAL LOW (ref >=60)

## 2021-12-31 LAB — LIPID PANEL W/REFLEX DIRECT LDL
Cholesterol: 227 mg/dL — ABNORMAL HIGH (ref <200)
HDL: 47 mg/dL (ref >=40)
LDL Cholesterol (Calc): 138 mg/dL (calc) — ABNORMAL HIGH
Non-HDL Cholesterol (Calc): 180 mg/dL (calc) — ABNORMAL HIGH (ref <130)
Total CHOL/HDL Ratio: 4.8 (calc) (ref <5.0)
Triglycerides: 271 mg/dL — ABNORMAL HIGH (ref <150)

## 2021-12-31 LAB — CBC
HCT: 44 % (ref 38.5–50.0)
Hemoglobin: 14.9 g/dL (ref 13.2–17.1)
MCH: 30.9 pg (ref 27.0–33.0)
MCHC: 33.9 g/dL (ref 32.0–36.0)
MCV: 91.3 fL (ref 80.0–100.0)
MPV: 9.8 fL (ref 7.5–12.5)
Platelets: 188 10*3/uL (ref 140–400)
RBC: 4.82 10*6/uL (ref 4.20–5.80)
RDW: 13.2 % (ref 11.0–15.0)
WBC: 6.1 10*3/uL (ref 3.8–10.8)

## 2021-12-31 LAB — PSA: PSA: 1.38 ng/mL (ref <=4.00)

## 2021-12-31 NOTE — Progress Notes (Signed)
Hi Steven Ashley, LDL and triglycerides are elevated.  Your 10-year cardiovascular risk score is almost 30% meaning that in the next 10 years you are at a 30% risk of developing significant heart disease.  I would highly recommend starting a statin to lower that risk.  They are usually taken at night and are generic.  If you are okay with starting that please let me know again I would strongly recommend it based on how high your risk is.  Kidney function also is up a little bit normally is around 1.1-1.2.  It was 1.3 this time.  Would like to recheck your renal function in about 2 weeks.  All other labs are okay.  The 10-year ASCVD risk score (Arnett DK, et al., 2019) is: 29.8%   Values used to calculate the score:     Age: 71 years     Sex: Male     Is Non-Hispanic African American: No     Diabetic: No     Tobacco smoker: No     Systolic Blood Pressure: 048 mmHg     Is BP treated: No     HDL Cholesterol: 47 mg/dL     Total Cholesterol: 227 mg/dL

## 2022-01-03 ENCOUNTER — Other Ambulatory Visit: Payer: Self-pay

## 2022-01-03 DIAGNOSIS — N289 Disorder of kidney and ureter, unspecified: Secondary | ICD-10-CM

## 2022-01-03 NOTE — Telephone Encounter (Signed)
Ordered Micro Albumin.

## 2022-01-03 NOTE — Progress Notes (Signed)
Ordered labs per [employer].

## 2022-01-12 DIAGNOSIS — N289 Disorder of kidney and ureter, unspecified: Secondary | ICD-10-CM | POA: Diagnosis not present

## 2022-01-13 ENCOUNTER — Other Ambulatory Visit: Payer: Self-pay | Admitting: Family Medicine

## 2022-01-13 DIAGNOSIS — R809 Proteinuria, unspecified: Secondary | ICD-10-CM

## 2022-01-13 DIAGNOSIS — R7989 Other specified abnormal findings of blood chemistry: Secondary | ICD-10-CM

## 2022-01-13 DIAGNOSIS — N289 Disorder of kidney and ureter, unspecified: Secondary | ICD-10-CM

## 2022-01-13 LAB — MICROALBUMIN / CREATININE URINE RATIO
Creatinine, Urine: 132 mg/dL (ref 20–320)
Microalb Creat Ratio: 165 mcg/mg creat — ABNORMAL HIGH (ref <30)
Microalb, Ur: 21.8 mg/dL

## 2022-01-13 LAB — BASIC METABOLIC PANEL WITH GFR
BUN/Creatinine Ratio: 25 (calc) — ABNORMAL HIGH (ref 6–22)
BUN: 33 mg/dL — ABNORMAL HIGH (ref 7–25)
CO2: 27 mmol/L (ref 20–32)
Calcium: 9.9 mg/dL (ref 8.6–10.3)
Chloride: 106 mmol/L (ref 98–110)
Creat: 1.3 mg/dL — ABNORMAL HIGH (ref 0.70–1.28)
Glucose, Bld: 103 mg/dL — ABNORMAL HIGH (ref 65–99)
Potassium: 4.9 mmol/L (ref 3.5–5.3)
Sodium: 140 mmol/L (ref 135–146)
eGFR: 58 mL/min/{1.73_m2} — ABNORMAL LOW (ref >=60)

## 2022-01-13 NOTE — Progress Notes (Signed)
Hi Steven Ashley, kidney function is similar to what it was 2 weeks ago it was 1.3 and it is still 1.3 but you do look a little bit more dry on your blood work this time.  Are spilling a little bit of protein into your urine as well.  Would like to move forward with an ultrasound of your kidneys and get a go ahead and place that order if you do not want to do it then please let us know but otherwise we will move forward and get that scheduled.

## 2022-01-20 ENCOUNTER — Ambulatory Visit (INDEPENDENT_AMBULATORY_CARE_PROVIDER_SITE_OTHER): Payer: Medicare HMO

## 2022-01-20 DIAGNOSIS — N289 Disorder of kidney and ureter, unspecified: Secondary | ICD-10-CM | POA: Diagnosis not present

## 2022-01-20 DIAGNOSIS — R809 Proteinuria, unspecified: Secondary | ICD-10-CM | POA: Diagnosis not present

## 2022-01-20 DIAGNOSIS — R7989 Other specified abnormal findings of blood chemistry: Secondary | ICD-10-CM | POA: Diagnosis not present

## 2022-01-20 DIAGNOSIS — N281 Cyst of kidney, acquired: Secondary | ICD-10-CM | POA: Diagnosis not present

## 2022-01-20 DIAGNOSIS — N200 Calculus of kidney: Secondary | ICD-10-CM | POA: Diagnosis not present

## 2022-01-20 DIAGNOSIS — N400 Enlarged prostate without lower urinary tract symptoms: Secondary | ICD-10-CM | POA: Diagnosis not present

## 2022-01-24 NOTE — Progress Notes (Signed)
Hi Steven Ashley, I am sorry the delay in getting back to you about your ultrasound results.  I was out of the office on Friday.  You have a kidney stone in the right kidney but its not causing any type of blockage or obstruction which is good.  But just be aware that it is there.  The prostate is a little bit enlarged.  But otherwise no worrisome findings on either kidney.  With the elevated kidney function and protein level I like to refer you to a nephrologist which is a kidney specialist.  If you are okay with that then please let me know.  It may take a month or 2 to get an appointment which is perfectly fine its not urgent but I would like to get their opinion about this.  If you have a preference for provider or location then please let us know.

## 2022-02-15 DIAGNOSIS — I10 Essential (primary) hypertension: Secondary | ICD-10-CM | POA: Diagnosis not present

## 2022-02-15 DIAGNOSIS — R809 Proteinuria, unspecified: Secondary | ICD-10-CM | POA: Diagnosis not present

## 2022-02-15 DIAGNOSIS — E785 Hyperlipidemia, unspecified: Secondary | ICD-10-CM | POA: Diagnosis not present

## 2022-02-15 DIAGNOSIS — N1831 Chronic kidney disease, stage 3a: Secondary | ICD-10-CM | POA: Diagnosis not present

## 2022-03-10 ENCOUNTER — Telehealth: Payer: Self-pay | Admitting: Family Medicine

## 2022-03-10 NOTE — Telephone Encounter (Signed)
Patient called in regards to a referral for a vein specialist he would like a call back to discuss options his number is [phone]

## 2022-03-11 NOTE — Telephone Encounter (Signed)
Patient advised.

## 2022-03-11 NOTE — Telephone Encounter (Signed)
Dr. Aleda Grana is good so I think he is fine to see him.  And I am really happy to hear that his blood pressure is much much better.

## 2022-03-11 NOTE — Telephone Encounter (Signed)
Steven Ashley has an appointment with [facility] on Monday for an ultrasound. He just wanted to know if Dr Madilyn Fireman recommends their office.   [name], MD Address: [address], [place], [place] [address] Phone: [phone]  FYI- The nephrologist recommended blood pressure medication. He did start blood pressure medication. Blood pressure is down to 115/63.

## 2022-03-14 DIAGNOSIS — I8312 Varicose veins of left lower extremity with inflammation: Secondary | ICD-10-CM | POA: Diagnosis not present

## 2022-03-14 DIAGNOSIS — I8311 Varicose veins of right lower extremity with inflammation: Secondary | ICD-10-CM | POA: Diagnosis not present

## 2022-03-14 DIAGNOSIS — M79661 Pain in right lower leg: Secondary | ICD-10-CM | POA: Diagnosis not present

## 2022-03-14 DIAGNOSIS — I83893 Varicose veins of bilateral lower extremities with other complications: Secondary | ICD-10-CM | POA: Diagnosis not present

## 2022-04-05 DIAGNOSIS — L738 Other specified follicular disorders: Secondary | ICD-10-CM | POA: Diagnosis not present

## 2022-04-05 DIAGNOSIS — I872 Venous insufficiency (chronic) (peripheral): Secondary | ICD-10-CM | POA: Diagnosis not present

## 2022-04-05 DIAGNOSIS — L218 Other seborrheic dermatitis: Secondary | ICD-10-CM | POA: Diagnosis not present

## 2022-04-05 DIAGNOSIS — D235 Other benign neoplasm of skin of trunk: Secondary | ICD-10-CM | POA: Diagnosis not present

## 2022-04-05 DIAGNOSIS — L80 Vitiligo: Secondary | ICD-10-CM | POA: Diagnosis not present

## 2022-04-12 DIAGNOSIS — R600 Localized edema: Secondary | ICD-10-CM | POA: Diagnosis not present

## 2022-04-12 DIAGNOSIS — I8311 Varicose veins of right lower extremity with inflammation: Secondary | ICD-10-CM | POA: Diagnosis not present

## 2022-04-12 DIAGNOSIS — I8312 Varicose veins of left lower extremity with inflammation: Secondary | ICD-10-CM | POA: Diagnosis not present

## 2022-04-12 DIAGNOSIS — R252 Cramp and spasm: Secondary | ICD-10-CM | POA: Diagnosis not present

## 2022-05-04 DIAGNOSIS — I872 Venous insufficiency (chronic) (peripheral): Secondary | ICD-10-CM | POA: Diagnosis not present

## 2022-05-11 ENCOUNTER — Other Ambulatory Visit: Payer: Self-pay | Admitting: Family Medicine

## 2022-05-12 ENCOUNTER — Other Ambulatory Visit (HOSPITAL_COMMUNITY): Payer: Self-pay

## 2022-05-12 ENCOUNTER — Other Ambulatory Visit: Payer: Self-pay | Admitting: Family Medicine

## 2022-05-12 MED ORDER — LISINOPRIL 10 MG PO TABS
10.0000 mg | ORAL_TABLET | Freq: Every day | ORAL | 1 refills | Status: DC
Start: 2022-05-12 — End: 2022-09-15
  Filled 2022-05-12 – 2022-05-13 (×2): qty 90, 90d supply, fill #0

## 2022-05-12 NOTE — Telephone Encounter (Signed)
Looks like this medication not prescribed here. Okay to send?

## 2022-05-13 ENCOUNTER — Other Ambulatory Visit (HOSPITAL_COMMUNITY): Payer: Self-pay

## 2022-05-13 ENCOUNTER — Telehealth: Payer: Self-pay | Admitting: Family Medicine

## 2022-05-13 ENCOUNTER — Other Ambulatory Visit: Payer: Self-pay

## 2022-05-13 DIAGNOSIS — R7989 Other specified abnormal findings of blood chemistry: Secondary | ICD-10-CM

## 2022-05-13 NOTE — Telephone Encounter (Signed)
Patient advised.

## 2022-05-13 NOTE — Telephone Encounter (Signed)
Call pt: Ok to go to lab anytime here in our suite.

## 2022-05-17 DIAGNOSIS — R7989 Other specified abnormal findings of blood chemistry: Secondary | ICD-10-CM | POA: Diagnosis not present

## 2022-05-18 ENCOUNTER — Other Ambulatory Visit: Payer: Self-pay | Admitting: Family Medicine

## 2022-05-18 LAB — BASIC METABOLIC PANEL WITH GFR
BUN/Creatinine Ratio: 20 (calc) (ref 6–22)
BUN: 35 mg/dL — ABNORMAL HIGH (ref 7–25)
CO2: 26 mmol/L (ref 20–32)
Calcium: 10 mg/dL (ref 8.6–10.3)
Chloride: 104 mmol/L (ref 98–110)
Creat: 1.74 mg/dL — ABNORMAL HIGH (ref 0.70–1.28)
Glucose, Bld: 120 mg/dL — ABNORMAL HIGH (ref 65–99)
Potassium: 5 mmol/L (ref 3.5–5.3)
Sodium: 141 mmol/L (ref 135–146)
eGFR: 41 mL/min/{1.73_m2} — ABNORMAL LOW (ref >=60)

## 2022-05-18 NOTE — Progress Notes (Signed)
Hi Steven Ashley, you just look very dry on your labs. The BUN went up instead of back down and the creatinine went up as well. Are you taking any workout supplements?  I want you to really push fluids and don't work out right before blood work and then recheck in 2 weeks.  We can consider looking at the renal arteries if levels are still high.

## 2022-05-19 ENCOUNTER — Encounter: Payer: Self-pay | Admitting: Family Medicine

## 2022-05-19 DIAGNOSIS — I83891 Varicose veins of right lower extremities with other complications: Secondary | ICD-10-CM | POA: Diagnosis not present

## 2022-05-19 DIAGNOSIS — Z09 Encounter for follow-up examination after completed treatment for conditions other than malignant neoplasm: Secondary | ICD-10-CM | POA: Diagnosis not present

## 2022-05-20 ENCOUNTER — Other Ambulatory Visit: Payer: Self-pay | Admitting: *Deleted

## 2022-05-20 DIAGNOSIS — R899 Unspecified abnormal finding in specimens from other organs, systems and tissues: Secondary | ICD-10-CM

## 2022-05-31 DIAGNOSIS — I83891 Varicose veins of right lower extremities with other complications: Secondary | ICD-10-CM | POA: Diagnosis not present

## 2022-05-31 DIAGNOSIS — I83811 Varicose veins of right lower extremities with pain: Secondary | ICD-10-CM | POA: Diagnosis not present

## 2022-06-03 ENCOUNTER — Ambulatory Visit (INDEPENDENT_AMBULATORY_CARE_PROVIDER_SITE_OTHER): Payer: Medicare HMO | Admitting: Family Medicine

## 2022-06-03 VITALS — Ht 70.0 in | Wt 219.0 lb

## 2022-06-03 DIAGNOSIS — Z0000 Encounter for general adult medical examination without abnormal findings: Secondary | ICD-10-CM

## 2022-06-03 NOTE — Progress Notes (Signed)
MEDICARE ANNUAL WELLNESS VISIT  06/03/2022  Telephone Visit Disclaimer This Medicare AWV was conducted by telephone due to national recommendations for restrictions regarding the COVID-19 Pandemic (e.g. social distancing).  I verified, using two identifiers, that I am speaking with Steven Ashley or their authorized healthcare agent. I discussed the limitations, risks, security, and privacy concerns of performing an evaluation and management service by telephone and the potential availability of an in-person appointment in the future. The patient expressed understanding and agreed to proceed.  Location of Patient: Home Location of Provider (nurse):  In the office.  Subjective:    Steven Ashley is a 72 y.o. male patient of Metheney, Rene Kocher, MD who had a Medicare Annual Wellness Visit today via telephone. Taryll is Retired and lives with their spouse. he has 4 children. he reports that he is socially active and does interact with friends/family regularly. he is moderately physically active and enjoys fishing and playing golf.  Patient Care Team: Hali Marry, MD as PCP - General     06/03/2022    8:15 AM 10/05/2021    8:28 PM 06/13/2017    8:14 AM  Advanced Directives  Does Patient Have a Medical Advance Directive? Yes No Yes  Type of Advance Directive Living will  [place];Living will  Does patient want to make changes to medical advance directive? No - Patient declined    Would patient like information on creating a medical advance directive?  No - Patient declined     Hospital Utilization Over the Past 12 Months: # of hospitalizations or ER visits: 2 # of surgeries: 0  Review of Systems    Patient reports that his overall health is better compared to last year.  History obtained from chart review and the patient  Patient Reported Readings (BP, Pulse, CBG, Weight, etc) Weight: 219- patient reported Height: 25f0 patient reported  Pain  Assessment Pain : No/denies pain     Current Medications & Allergies (verified) Allergies as of 06/03/2022       Reactions   Tetracycline    REACTION: Rash        Medication List        Accurate as of June 03, 2022  8:44 AM. If you have any questions, ask your nurse or doctor.          Calcium 500 + D 500-5 MG-MCG Tabs Generic drug: Calcium Carb-Cholecalciferol Take by mouth.   clobetasol 0.05 % external solution Commonly known as: TEMOVATE Apply 1 Application topically 2 (two) times daily.   Fish Oil 1200 MG Caps Take by mouth.   Iodine (Kelp) 0.15 MG Tabs Take by mouth.   lisinopril 10 MG tablet Commonly known as: ZESTRIL Take 1 tablet (10 mg total) by mouth daily.   METAMUCIL FIBER PO Take by mouth.   MILK THISTLE PO Take by mouth.   MULTIVITAMIN ADULT PO Take by mouth.   Opzelura 1.5 % Crea Generic drug: Ruxolitinib Phosphate Apply topically 2 (two) times daily.   Turmeric 1053 MG Tabs Take by mouth.        History (reviewed): Past Medical History:  Diagnosis Date   Arthritis    Cataract    Glaucoma    Past Surgical History:  Procedure Laterality Date   CERVICAL DISC SURGERY  2005   Family History  Problem Relation Age of Onset   Diabetes Unknown    Heart disease Mother    Coronary artery disease Mother  smoker   Dementia Mother    Diabetic kidney disease Mother    Parkinsonism Father        deceased   Benign prostatic hyperplasia Father        TURP   Colon cancer Neg Hx    Esophageal cancer Neg Hx    Liver cancer Neg Hx    Pancreatic cancer Neg Hx    Stomach cancer Neg Hx    Rectal cancer Neg Hx    Social History   Socioeconomic History   Marital status: Married    Spouse name: Tammy   Number of children: 4   Years of education: 14   Highest education level: Some college, no degree  Occupational History   Occupation: Retired  Tobacco Use   Smoking status: Never   Smokeless tobacco: Never  Vaping Use    Vaping Use: Never used  Substance and Sexual Activity   Alcohol use: Yes    Alcohol/week: 0.0 standard drinks of alcohol    Comment: occasional drink   Drug use: No   Sexual activity: Yes    Partners: Female    Comment: retired Hotel manager, 2 yrs college, married, 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  Other Topics Concern   Not on file  Social History Narrative   Lives with his wife. He has four children. He enjoys playing golf and fishing.   Social Determinants of Health   Financial Resource Strain: Low Risk  (06/03/2022)   Overall Financial Resource Strain (CARDIA)    Difficulty of Paying Living Expenses: Not hard at all  Food Insecurity: No Food Insecurity (06/03/2022)   Hunger Vital Sign    Worried About Running Out of Food in the Last Year: Never true    Ran Out of Food in the Last Year: Never true  Transportation Needs: No Transportation Needs (06/03/2022)   PRAPARE - Hydrologist (Medical): No    Lack of Transportation (Non-Medical): No  Physical Activity: Sufficiently Active (06/03/2022)   Exercise Vital Sign    Days of Exercise per Week: 5 days    Minutes of Exercise per Session: 50 min  Stress: Stress Concern Present (06/03/2022)   [place]    Feeling of Stress : To some extent  Social Connections: Moderately Integrated (06/03/2022)   Social Connection and Isolation Panel [NHANES]    Frequency of Communication with Friends and Family: More than three times a week    Frequency of Social Gatherings with Friends and Family: Three times a week    Attends Religious Services: More than 4 times per year    Active Member of Clubs or Organizations: No    Attends Archivist Meetings: Never    Marital Status: Married    Activities of Daily Living    06/03/2022    8:22 AM  In your present state of health, do you have any difficulty performing the following activities:  Hearing? 0  Vision? 0  Difficulty  concentrating or making decisions? 0  Walking or climbing stairs? 0  Dressing or bathing? 0  Doing errands, shopping? 0  Preparing Food and eating ? N  Using the Toilet? N  In the past six months, have you accidently leaked urine? N  Do you have problems with loss of bowel control? N  Managing your Medications? N  Managing your Finances? N  Housekeeping or managing your Housekeeping? N    Patient Education/ Literacy How often do you need to  have someone help you when you read instructions, pamphlets, or other written materials from your doctor or pharmacy?: 1 - Never What is the last grade level you completed in school?: two years of college  Exercise Current Exercise Habits: Home exercise routine, Type of exercise: strength training/weights;Other - see comments (elliptical), Time (Minutes): 50, Frequency (Times/Week): 5, Weekly Exercise (Minutes/Week): 250, Intensity: Moderate, Exercise limited by: None identified  Diet Patient reports consuming  2-3  meals a day and 1 snack(s) a day Patient reports that his primary diet is: Regular Patient reports that she does have regular access to food.   Depression Screen    06/03/2022    8:16 AM 12/30/2021    8:17 AM 10/05/2021    2:43 PM 10/30/2015    4:28 PM  PHQ 2/9 Scores  PHQ - 2 Score 0 0 0 0     Fall Risk    06/03/2022    8:16 AM 12/30/2021    8:17 AM 10/05/2021    2:43 PM 10/30/2015    4:28 PM  Gray in the past year? 1 1 1 $ No  Number falls in past yr: 0 0 0   Injury with Fall? 1 0 1   Risk for fall due to : History of fall(s) No Fall Risks Other (Comment)   Follow up Falls evaluation completed;Education provided;Falls prevention discussed Falls evaluation completed Falls prevention discussed;Falls evaluation completed      Objective:  Oak Bonfante seemed alert and oriented and he participated appropriately during our telephone visit.  Blood Pressure Weight BMI  BP Readings from Last 3 Encounters:  12/30/21 (!)  150/82  10/05/21 (!) 152/75  10/05/21 (!) 150/59   Wt Readings from Last 3 Encounters:  06/03/22 219 lb (99.3 kg)  12/30/21 223 lb (101.2 kg)  10/05/21 223 lb 15.8 oz (101.6 kg)   BMI Readings from Last 1 Encounters:  06/03/22 31.42 kg/m    *Unable to obtain current vital signs, weight, and BMI due to telephone visit type  Hearing/Vision  Jeneen Rinks did not seem to have difficulty with hearing/understanding during the telephone conversation Reports that he has had a formal eye exam by an eye care professional within the past year Reports that he has not had a formal hearing evaluation within the past year *Unable to fully assess hearing and vision during telephone visit type  Cognitive Function:    06/03/2022    8:35 AM  6CIT Screen  What Year? 0 points  What month? 0 points  What time? 0 points  Count back from 20 0 points  Months in reverse 0 points  Repeat phrase 0 points  Total Score 0 points   (Normal:0-7, Significant for Dysfunction: >8)  Normal Cognitive Function Screening: Yes   Immunization & Health Maintenance Record Immunization History  Administered Date(s) Administered   Influenza Split 03/02/2011   Influenza Whole 03/04/2010   Influenza-Unspecified 01/31/2015   PNEUMOCOCCAL CONJUGATE-20 12/30/2021   Pneumococcal Conjugate-13 05/01/2015   Td 04/26/2007   Tdap 09/26/2021    Health Maintenance  Topic Date Due   COVID-19 Vaccine (1) 06/19/2022 (Originally 10/25/1950)   INFLUENZA VACCINE  07/24/2022 (Originally 11/23/2021)   Zoster Vaccines- Shingrix (1 of 2) 09/01/2022 (Originally 04/27/1998)   Medicare Annual Wellness (AWV)  06/04/2023   COLONOSCOPY (Pts 45-42yr Insurance coverage will need to be confirmed)  06/14/2027   DTaP/Tdap/Td (3 - Td or Tdap) 09/27/2031   Pneumonia Vaccine 72 Years old  Completed   Hepatitis C  Screening  Completed   HPV VACCINES  Aged Out       Assessment  This is a routine wellness examination for [employer].  Health  Maintenance: Due or Overdue There are no preventive care reminders to display for this patient.   Steven Ashley does not need a referral for Community Assistance: Care Management:   no Social Work:    no Prescription Assistance:  no Nutrition/Diabetes Education:  no   Plan:  Personalized Goals  Goals Addressed               This Visit's Progress     Patient Stated (pt-stated)        Patient would like to continue losing weight. He would like to loose about 30 more lbs.       Personalized Health Maintenance & Screening Recommendations  Influenza vaccine Shingles vaccine  Lung Cancer Screening Recommended: no (Low Dose CT Chest recommended if Age 59-80 years, 30 pack-year currently smoking OR have quit w/in past 15 years) Hepatitis C Screening recommended: no HIV Screening recommended: no  Advanced Directives: Written information was not prepared per patient's request.  Referrals & Orders No orders of the defined types were placed in this encounter.   Follow-up Plan Follow-up with Hali Marry, MD as planned Schedule your shingles vaccine at the pharmacy. Medicare wellness visit in one year. Patient will access AVS on my chart.   I have personally reviewed and noted the following in the patient's chart:   Medical and social history Use of alcohol, tobacco or illicit drugs  Current medications and supplements Functional ability and status Nutritional status Physical activity Advanced directives List of other physicians Hospitalizations, surgeries, and ER visits in previous 12 months Vitals Screenings to include cognitive, depression, and falls Referrals and appointments  In addition, I have reviewed and discussed with Steven Ashley certain preventive protocols, quality metrics, and best practice recommendations. A written personalized care plan for preventive services as well as general preventive health recommendations is available and can be  mailed to the patient at his request.      Tinnie Gens, RN BSN  06/03/2022

## 2022-06-03 NOTE — Patient Instructions (Signed)
[place] Maintenance Summary and Written Plan of Care  [name] ,  Thank you for allowing me to perform your Medicare Annual Wellness Visit and for your ongoing commitment to your health.   Health Maintenance & Immunization History Health Maintenance  Topic Date Due   COVID-19 Vaccine (1) 06/19/2022 (Originally 10/25/1950)   INFLUENZA VACCINE  07/24/2022 (Originally 11/23/2021)   Zoster Vaccines- Shingrix (1 of 2) 09/01/2022 (Originally 04/27/1998)   Medicare Annual Wellness (AWV)  06/04/2023   COLONOSCOPY (Pts 45-35yr Insurance coverage will need to be confirmed)  06/14/2027   DTaP/Tdap/Td (3 - Td or Tdap) 09/27/2031   Pneumonia Vaccine 72 Years old  Completed   Hepatitis C Screening  Completed   HPV VACCINES  Aged Out   Immunization History  Administered Date(s) Administered   Influenza Split 03/02/2011   Influenza Whole 03/04/2010   Influenza-Unspecified 01/31/2015   PNEUMOCOCCAL CONJUGATE-20 12/30/2021   Pneumococcal Conjugate-13 05/01/2015   Td 04/26/2007   Tdap 09/26/2021    These are the patient goals that we discussed:  Goals Addressed               This Visit's Progress     Patient Stated (pt-stated)        Patient would like to continue losing weight. He would like to loose about 30 more lbs.         This is a list of Health Maintenance Items that are overdue or due now: Influenza vaccine Shingles vaccine    Orders/Referrals Placed Today: No orders of the defined types were placed in this encounter.  (Contact our referral department at [phone] you have not spoken with someone about your referral appointment within the next 5 days)    Follow-up Plan Follow-up with MHali Marry MD as planned Schedule your shingles vaccine at the pharmacy. Medicare wellness visit in one year. Patient will access AVS on my chart.      Health Maintenance, Male Adopting a healthy lifestyle and getting preventive care  are important in promoting health and wellness. Ask your health care provider about: The right schedule for you to have regular tests and exams. Things you can do on your own to prevent diseases and keep yourself healthy. What should I know about diet, weight, and exercise? Eat a healthy diet  Eat a diet that includes plenty of vegetables, fruits, low-fat dairy products, and lean protein. Do not eat a lot of foods that are high in solid fats, added sugars, or sodium. Maintain a healthy weight Body mass index (BMI) is a measurement that can be used to identify possible weight problems. It estimates body fat based on height and weight. Your health care provider can help determine your BMI and help you achieve or maintain a healthy weight. Get regular exercise Get regular exercise. This is one of the most important things you can do for your health. Most adults should: Exercise for at least 150 minutes each week. The exercise should increase your heart rate and make you sweat (moderate-intensity exercise). Do strengthening exercises at least twice a week. This is in addition to the moderate-intensity exercise. Spend less time sitting. Even light physical activity can be beneficial. Watch cholesterol and blood lipids Have your blood tested for lipids and cholesterol at 74years of age, then have this test every 5 years. You may need to have your cholesterol levels checked more often if: Your lipid or cholesterol levels are high. You are older than 74 years  of age. You are at high risk for heart disease. What should I know about cancer screening? Many types of cancers can be detected early and may often be prevented. Depending on your health history and family history, you may need to have cancer screening at various ages. This may include screening for: Colorectal cancer. Prostate cancer. Skin cancer. Lung cancer. What should I know about heart disease, diabetes, and high blood  pressure? Blood pressure and heart disease High blood pressure causes heart disease and increases the risk of stroke. This is more likely to develop in people who have high blood pressure readings or are overweight. Talk with your health care provider about your target blood pressure readings. Have your blood pressure checked: Every 3-5 years if you are 42-42 years of age. Every year if you are 66 years old or older. If you are between the ages of 81 and 29 and are a current or former smoker, ask your health care provider if you should have a one-time screening for abdominal aortic aneurysm (AAA). Diabetes Have regular diabetes screenings. This checks your fasting blood sugar level. Have the screening done: Once every three years after age 7 if you are at a normal weight and have a low risk for diabetes. More often and at a younger age if you are overweight or have a high risk for diabetes. What should I know about preventing infection? Hepatitis B If you have a higher risk for hepatitis B, you should be screened for this virus. Talk with your health care provider to find out if you are at risk for hepatitis B infection. Hepatitis C Blood testing is recommended for: Everyone born from [birth year] through 1967. Anyone with known risk factors for hepatitis C. Sexually transmitted infections (STIs) You should be screened each year for STIs, including gonorrhea and chlamydia, if: You are sexually active and are younger than 74 years of age. You are older than 74 years of age and your health care provider tells you that you are at risk for this type of infection. Your sexual activity has changed since you were last screened, and you are at increased risk for chlamydia or gonorrhea. Ask your health care provider if you are at risk. Ask your health care provider about whether you are at high risk for HIV. Your health care provider may recommend a prescription medicine to help prevent HIV infection. If you  choose to take medicine to prevent HIV, you should first get tested for HIV. You should then be tested every 3 months for as long as you are taking the medicine. Follow these instructions at home: Alcohol use Do not drink alcohol if your health care provider tells you not to drink. If you drink alcohol: Limit how much you have to 0-2 drinks a day. Know how much alcohol is in your drink. In the [place] one drink equals one 12 oz bottle of beer (355 mL), one 5 oz glass of wine (148 mL), or one 1 oz glass of hard liquor (44 mL). Lifestyle Do not use any products that contain nicotine or tobacco. These products include cigarettes, chewing tobacco, and vaping devices, such as e-cigarettes. If you need help quitting, ask your health care provider. Do not use street drugs. Do not share needles. Ask your health care provider for help if you need support or information about quitting drugs. General instructions Schedule regular health, dental, and eye exams. Stay current with your vaccines. Tell your health care provider if: You often feel  depressed. You have ever been abused or do not feel safe at home. Summary Adopting a healthy lifestyle and getting preventive care are important in promoting health and wellness. Follow your health care provider's instructions about healthy diet, exercising, and getting tested or screened for diseases. Follow your health care provider's instructions on monitoring your cholesterol and blood pressure. This information is not intended to replace advice given to you by your health care provider. Make sure you discuss any questions you have with your health care provider. Document Revised: 08/31/2020 Document Reviewed: 08/31/2020 Elsevier Patient Education  [employer].

## 2022-06-14 DIAGNOSIS — I83811 Varicose veins of right lower extremities with pain: Secondary | ICD-10-CM | POA: Diagnosis not present

## 2022-06-14 DIAGNOSIS — I83891 Varicose veins of right lower extremities with other complications: Secondary | ICD-10-CM | POA: Diagnosis not present

## 2022-06-14 DIAGNOSIS — M7989 Other specified soft tissue disorders: Secondary | ICD-10-CM | POA: Diagnosis not present

## 2022-07-11 DIAGNOSIS — M79662 Pain in left lower leg: Secondary | ICD-10-CM | POA: Diagnosis not present

## 2022-07-11 DIAGNOSIS — I83892 Varicose veins of left lower extremities with other complications: Secondary | ICD-10-CM | POA: Diagnosis not present

## 2022-07-27 DIAGNOSIS — I83892 Varicose veins of left lower extremities with other complications: Secondary | ICD-10-CM | POA: Diagnosis not present

## 2022-07-27 DIAGNOSIS — I87392 Chronic venous hypertension (idiopathic) with other complications of left lower extremity: Secondary | ICD-10-CM | POA: Diagnosis not present

## 2022-09-07 DIAGNOSIS — R252 Cramp and spasm: Secondary | ICD-10-CM | POA: Diagnosis not present

## 2022-09-07 DIAGNOSIS — R600 Localized edema: Secondary | ICD-10-CM | POA: Diagnosis not present

## 2022-09-07 DIAGNOSIS — I8311 Varicose veins of right lower extremity with inflammation: Secondary | ICD-10-CM | POA: Diagnosis not present

## 2022-09-07 DIAGNOSIS — I8312 Varicose veins of left lower extremity with inflammation: Secondary | ICD-10-CM | POA: Diagnosis not present

## 2022-09-15 ENCOUNTER — Encounter: Payer: Self-pay | Admitting: Family Medicine

## 2022-09-15 ENCOUNTER — Ambulatory Visit (INDEPENDENT_AMBULATORY_CARE_PROVIDER_SITE_OTHER): Payer: Medicare HMO | Admitting: Family Medicine

## 2022-09-15 VITALS — BP 113/53 | HR 53 | Ht 70.0 in | Wt 226.0 lb

## 2022-09-15 DIAGNOSIS — T464X5A Adverse effect of angiotensin-converting-enzyme inhibitors, initial encounter: Secondary | ICD-10-CM | POA: Diagnosis not present

## 2022-09-15 DIAGNOSIS — J301 Allergic rhinitis due to pollen: Secondary | ICD-10-CM

## 2022-09-15 DIAGNOSIS — R040 Epistaxis: Secondary | ICD-10-CM

## 2022-09-15 DIAGNOSIS — R058 Other specified cough: Secondary | ICD-10-CM

## 2022-09-15 MED ORDER — MUPIROCIN 2 % EX OINT: TOPICAL_OINTMENT | CUTANEOUS | 0 refills | Status: AC

## 2022-09-15 MED ORDER — LOSARTAN POTASSIUM 25 MG PO TABS: 25.0000 mg | ORAL_TABLET | Freq: Every day | ORAL | 1 refills | Status: DC

## 2022-09-15 NOTE — Patient Instructions (Signed)
While using the nasal ointment on the right side please take a Zyrtec tablet daily in the evening for 10 days.  After that you can switch to a Flonase or Nasonex product.

## 2022-09-15 NOTE — Progress Notes (Signed)
Established Patient Office Visit  Subjective   Patient ID: Steven Ashley, male    DOB: 07-23-1950  Age: 72 y.o. MRN: [id number]  No chief complaint on file.   HPI  Pt states that he last experienced a nose bleed approximately 1.5 weeks ago. He states that these come and go he doesn't realize when they happen someone will usually point this out to him that his nose is bleeding.,he feels that it may also have something to do with some sinus drainage which is all the time.  It is mostly coming from the right nostril.  He does tend to blow his nose more forcefully.  He does use Afrin occasionally but says he does not use it every night just when he is really congested.   He feels that his cough is related to the Lisinopril that he has been taking. Cough is mostly from the throat area. Sometimes mild but sometimes more intesen.         ROS    Objective:     BP (!) 113/53   Pulse (!) 53   Ht 5\' 10"  (1.778 m)   Wt 226 lb (102.5 kg)   SpO2 97%   BMI 32.43 kg/m    Physical Exam Constitutional:      Appearance: He is well-developed.  HENT:     Head: Normocephalic and atraumatic.     Right Ear: Tympanic membrane, ear canal and external ear normal.     Left Ear: Tympanic membrane, ear canal and external ear normal.     Nose: Nose normal.     Mouth/Throat:     Pharynx: Oropharynx is clear.  Eyes:     Conjunctiva/sclera: Conjunctivae normal.     Pupils: Pupils are equal, round, and reactive to light.  Neck:     Thyroid: No thyromegaly.  Cardiovascular:     Rate and Rhythm: Normal rate and regular rhythm.     Heart sounds: Normal heart sounds.  Pulmonary:     Effort: Pulmonary effort is normal.     Breath sounds: Normal breath sounds.  Musculoskeletal:     Cervical back: Neck supple.  Lymphadenopathy:     Cervical: No cervical adenopathy.  Skin:    General: Skin is warm and dry.  Neurological:     Mental Status: He is alert and oriented to person, place, and time.   Psychiatric:        Behavior: Behavior normal.      No results found for any visits on 09/15/22.    The 10-year ASCVD risk score (Arnett DK, et al., 2019) is: 23.5%    Assessment & Plan:   Problem List Items Addressed This Visit   None Visit Diagnoses     Bleeding from the nose    -  Primary   Relevant Orders   Ambulatory referral to ENT   Seasonal allergic rhinitis due to pollen       Cough due to ACE inhibitor       Relevant Medications   losartan (COZAAR) 25 MG tablet      Off-discussed that it certainly could be secondary to the ACE inhibitor so we will switch to an ARB.  Did warn him that there is a very small percentage of people that still experience a cough on an ARB but most the time it will resolve after a few weeks.  If not improving then please let me know.  Do think postnasal drip is part of what is  causing his symptoms as well so I do not suspect the cough to 100% improved.   allergic rhinitis-recommend a trial of Zyrtec for the next 10 days and then at that point can switch to nasal steroid if he would like.   Bleeding from the right nostril-recommend mupirocin ointment twice a day for 10 days to the affected area avoid forceful blowing of the nose.  Also recommend oral Zyrtec to help control allergic rhinitis and drip and drainage for the next 10 days and then if he is doing well at that point could do a trial of a nasal steroid spray if he would prefer.  Recommend keeping Afrin use to a minimum.   Return if symptoms worsen or fail to improve.    Nani Gasser, MD

## 2022-09-15 NOTE — Progress Notes (Signed)
Pt states that he last experienced a nose bleed approximately 1.5 weeks ago. He states that these come and go he doesn't realize when they happen someone will usually point this out to him that his nose is bleeding.,he feels that it may also have something to do with some sinus drainage which is all the time    He feels that his cough is related to the Lisinopril that he has been taking.

## 2022-09-29 ENCOUNTER — Telehealth: Payer: Medicare HMO | Admitting: Physician Assistant

## 2022-09-29 DIAGNOSIS — J019 Acute sinusitis, unspecified: Secondary | ICD-10-CM

## 2022-09-29 DIAGNOSIS — B9689 Other specified bacterial agents as the cause of diseases classified elsewhere: Secondary | ICD-10-CM | POA: Diagnosis not present

## 2022-09-29 MED ORDER — AMOXICILLIN-POT CLAVULANATE 875-125 MG PO TABS: 1.0000 | ORAL_TABLET | Freq: Two times a day (BID) | ORAL | 0 refills | Status: DC

## 2022-09-29 MED ORDER — BENZONATATE 100 MG PO CAPS: 100.0000 mg | ORAL_CAPSULE | Freq: Three times a day (TID) | ORAL | 0 refills | Status: DC | PRN

## 2022-09-29 NOTE — Progress Notes (Signed)
E-Visit for Sinus Problems  We are sorry that you are not feeling well.  Here is how we plan to help!  Based on what you have shared with me it looks like you have sinusitis.  Sinusitis is inflammation and infection in the sinus cavities of the head.  Based on your presentation I believe you most likely have Acute Bacterial Sinusitis.  This is an infection caused by bacteria and is treated with antibiotics. I have prescribed Augmentin 875mg /125mg  one tablet twice daily with food, for 7 days. You may use an oral decongestant such as Mucinex D or if you have glaucoma or high blood pressure use plain Mucinex. Saline nasal spray help and can safely be used as often as needed for congestion.  If you develop worsening sinus pain, fever or notice severe headache and vision changes, or if symptoms are not better after completion of antibiotic, please schedule an appointment with a health care provider.     I have also sent in Tessalon for cough.   Sinus infections are not as easily transmitted as other respiratory infection, however we still recommend that you avoid close contact with loved ones, especially the very young and elderly.  Remember to wash your hands thoroughly throughout the day as this is the number one way to prevent the spread of infection!  Home Care: Only take medications as instructed by your medical team. Complete the entire course of an antibiotic. Do not take these medications with alcohol. A steam or ultrasonic humidifier can help congestion.  You can place a towel over your head and breathe in the steam from hot water coming from a faucet. Avoid close contacts especially the very young and the elderly. Cover your mouth when you cough or sneeze. Always remember to wash your hands.  Get Help Right Away If: You develop worsening fever or sinus pain. You develop a severe head ache or visual changes. Your symptoms persist after you have completed your treatment plan.  Make sure  you Understand these instructions. Will watch your condition. Will get help right away if you are not doing well or get worse.  Thank you for choosing an e-visit.  Your e-visit answers were reviewed by a board certified advanced clinical practitioner to complete your personal care plan. Depending upon the condition, your plan could have included both over the counter or prescription medications.  Please review your pharmacy choice. Make sure the pharmacy is open so you can pick up prescription now. If there is a problem, you may contact your provider through [employer] and have the prescription routed to another pharmacy.  Your safety is important to [place]. If you have drug allergies check your prescription carefully.   For the next 24 hours you can use MyChart to ask questions about today's visit, request a non-urgent call back, or ask for a work or school excuse. You will get an email in the next two days asking about your experience. I hope that your e-visit has been valuable and will speed your recovery.

## 2022-09-29 NOTE — Progress Notes (Signed)
I have spent 5 minutes in review of e-visit questionnaire, review and updating patient chart, medical decision making and response to patient.   Rinnah Peppel Cody Breianna Delfino, PA-C    

## 2022-10-13 DIAGNOSIS — M9901 Segmental and somatic dysfunction of cervical region: Secondary | ICD-10-CM | POA: Diagnosis not present

## 2022-10-13 DIAGNOSIS — M50321 Other cervical disc degeneration at C4-C5 level: Secondary | ICD-10-CM | POA: Diagnosis not present

## 2022-10-13 DIAGNOSIS — M542 Cervicalgia: Secondary | ICD-10-CM | POA: Diagnosis not present

## 2022-10-13 DIAGNOSIS — M9902 Segmental and somatic dysfunction of thoracic region: Secondary | ICD-10-CM | POA: Diagnosis not present

## 2022-10-13 DIAGNOSIS — M9903 Segmental and somatic dysfunction of lumbar region: Secondary | ICD-10-CM | POA: Diagnosis not present

## 2022-10-17 DIAGNOSIS — M542 Cervicalgia: Secondary | ICD-10-CM | POA: Diagnosis not present

## 2022-10-17 DIAGNOSIS — M9901 Segmental and somatic dysfunction of cervical region: Secondary | ICD-10-CM | POA: Diagnosis not present

## 2022-10-17 DIAGNOSIS — M9903 Segmental and somatic dysfunction of lumbar region: Secondary | ICD-10-CM | POA: Diagnosis not present

## 2022-10-17 DIAGNOSIS — M50321 Other cervical disc degeneration at C4-C5 level: Secondary | ICD-10-CM | POA: Diagnosis not present

## 2022-10-17 DIAGNOSIS — M9902 Segmental and somatic dysfunction of thoracic region: Secondary | ICD-10-CM | POA: Diagnosis not present

## 2022-10-19 DIAGNOSIS — M9907 Segmental and somatic dysfunction of upper extremity: Secondary | ICD-10-CM | POA: Diagnosis not present

## 2022-10-19 DIAGNOSIS — M542 Cervicalgia: Secondary | ICD-10-CM | POA: Diagnosis not present

## 2022-10-19 DIAGNOSIS — M9901 Segmental and somatic dysfunction of cervical region: Secondary | ICD-10-CM | POA: Diagnosis not present

## 2022-10-19 DIAGNOSIS — M9903 Segmental and somatic dysfunction of lumbar region: Secondary | ICD-10-CM | POA: Diagnosis not present

## 2022-10-19 DIAGNOSIS — M50321 Other cervical disc degeneration at C4-C5 level: Secondary | ICD-10-CM | POA: Diagnosis not present

## 2022-10-19 DIAGNOSIS — M9902 Segmental and somatic dysfunction of thoracic region: Secondary | ICD-10-CM | POA: Diagnosis not present

## 2022-10-21 DIAGNOSIS — M9902 Segmental and somatic dysfunction of thoracic region: Secondary | ICD-10-CM | POA: Diagnosis not present

## 2022-10-21 DIAGNOSIS — M9907 Segmental and somatic dysfunction of upper extremity: Secondary | ICD-10-CM | POA: Diagnosis not present

## 2022-10-21 DIAGNOSIS — M50321 Other cervical disc degeneration at C4-C5 level: Secondary | ICD-10-CM | POA: Diagnosis not present

## 2022-10-21 DIAGNOSIS — M9903 Segmental and somatic dysfunction of lumbar region: Secondary | ICD-10-CM | POA: Diagnosis not present

## 2022-10-21 DIAGNOSIS — M542 Cervicalgia: Secondary | ICD-10-CM | POA: Diagnosis not present

## 2022-10-21 DIAGNOSIS — M9901 Segmental and somatic dysfunction of cervical region: Secondary | ICD-10-CM | POA: Diagnosis not present

## 2022-10-28 DIAGNOSIS — M9902 Segmental and somatic dysfunction of thoracic region: Secondary | ICD-10-CM | POA: Diagnosis not present

## 2022-10-28 DIAGNOSIS — M9901 Segmental and somatic dysfunction of cervical region: Secondary | ICD-10-CM | POA: Diagnosis not present

## 2022-10-28 DIAGNOSIS — M50321 Other cervical disc degeneration at C4-C5 level: Secondary | ICD-10-CM | POA: Diagnosis not present

## 2022-10-28 DIAGNOSIS — M542 Cervicalgia: Secondary | ICD-10-CM | POA: Diagnosis not present

## 2022-10-31 DIAGNOSIS — J342 Deviated nasal septum: Secondary | ICD-10-CM | POA: Diagnosis not present

## 2022-10-31 DIAGNOSIS — M50321 Other cervical disc degeneration at C4-C5 level: Secondary | ICD-10-CM | POA: Diagnosis not present

## 2022-10-31 DIAGNOSIS — R0989 Other specified symptoms and signs involving the circulatory and respiratory systems: Secondary | ICD-10-CM | POA: Diagnosis not present

## 2022-10-31 DIAGNOSIS — M9902 Segmental and somatic dysfunction of thoracic region: Secondary | ICD-10-CM | POA: Diagnosis not present

## 2022-10-31 DIAGNOSIS — M9901 Segmental and somatic dysfunction of cervical region: Secondary | ICD-10-CM | POA: Diagnosis not present

## 2022-10-31 DIAGNOSIS — R040 Epistaxis: Secondary | ICD-10-CM | POA: Diagnosis not present

## 2022-10-31 DIAGNOSIS — M542 Cervicalgia: Secondary | ICD-10-CM | POA: Diagnosis not present

## 2022-11-02 DIAGNOSIS — M9907 Segmental and somatic dysfunction of upper extremity: Secondary | ICD-10-CM | POA: Diagnosis not present

## 2022-11-02 DIAGNOSIS — M9901 Segmental and somatic dysfunction of cervical region: Secondary | ICD-10-CM | POA: Diagnosis not present

## 2022-11-02 DIAGNOSIS — M9902 Segmental and somatic dysfunction of thoracic region: Secondary | ICD-10-CM | POA: Diagnosis not present

## 2022-11-02 DIAGNOSIS — M542 Cervicalgia: Secondary | ICD-10-CM | POA: Diagnosis not present

## 2022-11-02 DIAGNOSIS — M50321 Other cervical disc degeneration at C4-C5 level: Secondary | ICD-10-CM | POA: Diagnosis not present

## 2022-11-02 DIAGNOSIS — M9903 Segmental and somatic dysfunction of lumbar region: Secondary | ICD-10-CM | POA: Diagnosis not present

## 2022-11-04 DIAGNOSIS — M9903 Segmental and somatic dysfunction of lumbar region: Secondary | ICD-10-CM | POA: Diagnosis not present

## 2022-11-04 DIAGNOSIS — M9901 Segmental and somatic dysfunction of cervical region: Secondary | ICD-10-CM | POA: Diagnosis not present

## 2022-11-04 DIAGNOSIS — M542 Cervicalgia: Secondary | ICD-10-CM | POA: Diagnosis not present

## 2022-11-04 DIAGNOSIS — M50321 Other cervical disc degeneration at C4-C5 level: Secondary | ICD-10-CM | POA: Diagnosis not present

## 2022-11-04 DIAGNOSIS — M9902 Segmental and somatic dysfunction of thoracic region: Secondary | ICD-10-CM | POA: Diagnosis not present

## 2022-11-04 DIAGNOSIS — M9907 Segmental and somatic dysfunction of upper extremity: Secondary | ICD-10-CM | POA: Diagnosis not present

## 2022-11-07 DIAGNOSIS — M9903 Segmental and somatic dysfunction of lumbar region: Secondary | ICD-10-CM | POA: Diagnosis not present

## 2022-11-07 DIAGNOSIS — M50321 Other cervical disc degeneration at C4-C5 level: Secondary | ICD-10-CM | POA: Diagnosis not present

## 2022-11-07 DIAGNOSIS — M9901 Segmental and somatic dysfunction of cervical region: Secondary | ICD-10-CM | POA: Diagnosis not present

## 2022-11-07 DIAGNOSIS — M9902 Segmental and somatic dysfunction of thoracic region: Secondary | ICD-10-CM | POA: Diagnosis not present

## 2022-11-07 DIAGNOSIS — M542 Cervicalgia: Secondary | ICD-10-CM | POA: Diagnosis not present

## 2022-11-07 DIAGNOSIS — M9907 Segmental and somatic dysfunction of upper extremity: Secondary | ICD-10-CM | POA: Diagnosis not present

## 2022-11-09 DIAGNOSIS — M9901 Segmental and somatic dysfunction of cervical region: Secondary | ICD-10-CM | POA: Diagnosis not present

## 2022-11-09 DIAGNOSIS — M9907 Segmental and somatic dysfunction of upper extremity: Secondary | ICD-10-CM | POA: Diagnosis not present

## 2022-11-09 DIAGNOSIS — M9903 Segmental and somatic dysfunction of lumbar region: Secondary | ICD-10-CM | POA: Diagnosis not present

## 2022-11-09 DIAGNOSIS — M9902 Segmental and somatic dysfunction of thoracic region: Secondary | ICD-10-CM | POA: Diagnosis not present

## 2022-11-11 DIAGNOSIS — M9907 Segmental and somatic dysfunction of upper extremity: Secondary | ICD-10-CM | POA: Diagnosis not present

## 2022-11-11 DIAGNOSIS — M9902 Segmental and somatic dysfunction of thoracic region: Secondary | ICD-10-CM | POA: Diagnosis not present

## 2022-11-11 DIAGNOSIS — M9901 Segmental and somatic dysfunction of cervical region: Secondary | ICD-10-CM | POA: Diagnosis not present

## 2022-11-11 DIAGNOSIS — M9903 Segmental and somatic dysfunction of lumbar region: Secondary | ICD-10-CM | POA: Diagnosis not present

## 2022-11-16 DIAGNOSIS — M9901 Segmental and somatic dysfunction of cervical region: Secondary | ICD-10-CM | POA: Diagnosis not present

## 2022-11-16 DIAGNOSIS — M9903 Segmental and somatic dysfunction of lumbar region: Secondary | ICD-10-CM | POA: Diagnosis not present

## 2022-11-16 DIAGNOSIS — M9907 Segmental and somatic dysfunction of upper extremity: Secondary | ICD-10-CM | POA: Diagnosis not present

## 2022-11-16 DIAGNOSIS — M9902 Segmental and somatic dysfunction of thoracic region: Secondary | ICD-10-CM | POA: Diagnosis not present

## 2022-11-18 DIAGNOSIS — M9902 Segmental and somatic dysfunction of thoracic region: Secondary | ICD-10-CM | POA: Diagnosis not present

## 2022-11-18 DIAGNOSIS — M9903 Segmental and somatic dysfunction of lumbar region: Secondary | ICD-10-CM | POA: Diagnosis not present

## 2022-11-18 DIAGNOSIS — M9901 Segmental and somatic dysfunction of cervical region: Secondary | ICD-10-CM | POA: Diagnosis not present

## 2022-11-18 DIAGNOSIS — M9907 Segmental and somatic dysfunction of upper extremity: Secondary | ICD-10-CM | POA: Diagnosis not present

## 2022-11-23 DIAGNOSIS — M9903 Segmental and somatic dysfunction of lumbar region: Secondary | ICD-10-CM | POA: Diagnosis not present

## 2022-11-23 DIAGNOSIS — M9901 Segmental and somatic dysfunction of cervical region: Secondary | ICD-10-CM | POA: Diagnosis not present

## 2022-11-23 DIAGNOSIS — M9902 Segmental and somatic dysfunction of thoracic region: Secondary | ICD-10-CM | POA: Diagnosis not present

## 2022-11-23 DIAGNOSIS — M9907 Segmental and somatic dysfunction of upper extremity: Secondary | ICD-10-CM | POA: Diagnosis not present

## 2022-11-25 DIAGNOSIS — M9907 Segmental and somatic dysfunction of upper extremity: Secondary | ICD-10-CM | POA: Diagnosis not present

## 2022-11-25 DIAGNOSIS — M9902 Segmental and somatic dysfunction of thoracic region: Secondary | ICD-10-CM | POA: Diagnosis not present

## 2022-11-25 DIAGNOSIS — M9903 Segmental and somatic dysfunction of lumbar region: Secondary | ICD-10-CM | POA: Diagnosis not present

## 2022-11-25 DIAGNOSIS — M9901 Segmental and somatic dysfunction of cervical region: Secondary | ICD-10-CM | POA: Diagnosis not present

## 2022-11-28 DIAGNOSIS — M9902 Segmental and somatic dysfunction of thoracic region: Secondary | ICD-10-CM | POA: Diagnosis not present

## 2022-11-28 DIAGNOSIS — M9907 Segmental and somatic dysfunction of upper extremity: Secondary | ICD-10-CM | POA: Diagnosis not present

## 2022-11-28 DIAGNOSIS — M9901 Segmental and somatic dysfunction of cervical region: Secondary | ICD-10-CM | POA: Diagnosis not present

## 2022-11-28 DIAGNOSIS — M9903 Segmental and somatic dysfunction of lumbar region: Secondary | ICD-10-CM | POA: Diagnosis not present

## 2022-11-30 DIAGNOSIS — M9901 Segmental and somatic dysfunction of cervical region: Secondary | ICD-10-CM | POA: Diagnosis not present

## 2022-11-30 DIAGNOSIS — M9903 Segmental and somatic dysfunction of lumbar region: Secondary | ICD-10-CM | POA: Diagnosis not present

## 2022-11-30 DIAGNOSIS — M9907 Segmental and somatic dysfunction of upper extremity: Secondary | ICD-10-CM | POA: Diagnosis not present

## 2022-11-30 DIAGNOSIS — M9902 Segmental and somatic dysfunction of thoracic region: Secondary | ICD-10-CM | POA: Diagnosis not present

## 2022-12-05 DIAGNOSIS — M9902 Segmental and somatic dysfunction of thoracic region: Secondary | ICD-10-CM | POA: Diagnosis not present

## 2022-12-05 DIAGNOSIS — M9907 Segmental and somatic dysfunction of upper extremity: Secondary | ICD-10-CM | POA: Diagnosis not present

## 2022-12-05 DIAGNOSIS — M9903 Segmental and somatic dysfunction of lumbar region: Secondary | ICD-10-CM | POA: Diagnosis not present

## 2022-12-05 DIAGNOSIS — M9901 Segmental and somatic dysfunction of cervical region: Secondary | ICD-10-CM | POA: Diagnosis not present

## 2022-12-07 DIAGNOSIS — M9907 Segmental and somatic dysfunction of upper extremity: Secondary | ICD-10-CM | POA: Diagnosis not present

## 2022-12-07 DIAGNOSIS — M9902 Segmental and somatic dysfunction of thoracic region: Secondary | ICD-10-CM | POA: Diagnosis not present

## 2022-12-07 DIAGNOSIS — M9903 Segmental and somatic dysfunction of lumbar region: Secondary | ICD-10-CM | POA: Diagnosis not present

## 2022-12-07 DIAGNOSIS — M9901 Segmental and somatic dysfunction of cervical region: Secondary | ICD-10-CM | POA: Diagnosis not present

## 2022-12-08 DIAGNOSIS — I83893 Varicose veins of bilateral lower extremities with other complications: Secondary | ICD-10-CM | POA: Diagnosis not present

## 2022-12-12 DIAGNOSIS — M9907 Segmental and somatic dysfunction of upper extremity: Secondary | ICD-10-CM | POA: Diagnosis not present

## 2022-12-12 DIAGNOSIS — M9903 Segmental and somatic dysfunction of lumbar region: Secondary | ICD-10-CM | POA: Diagnosis not present

## 2022-12-12 DIAGNOSIS — M9902 Segmental and somatic dysfunction of thoracic region: Secondary | ICD-10-CM | POA: Diagnosis not present

## 2022-12-12 DIAGNOSIS — M9901 Segmental and somatic dysfunction of cervical region: Secondary | ICD-10-CM | POA: Diagnosis not present

## 2022-12-14 DIAGNOSIS — M9902 Segmental and somatic dysfunction of thoracic region: Secondary | ICD-10-CM | POA: Diagnosis not present

## 2022-12-14 DIAGNOSIS — M9907 Segmental and somatic dysfunction of upper extremity: Secondary | ICD-10-CM | POA: Diagnosis not present

## 2022-12-14 DIAGNOSIS — M9901 Segmental and somatic dysfunction of cervical region: Secondary | ICD-10-CM | POA: Diagnosis not present

## 2022-12-14 DIAGNOSIS — M9903 Segmental and somatic dysfunction of lumbar region: Secondary | ICD-10-CM | POA: Diagnosis not present

## 2022-12-21 DIAGNOSIS — M9907 Segmental and somatic dysfunction of upper extremity: Secondary | ICD-10-CM | POA: Diagnosis not present

## 2022-12-21 DIAGNOSIS — M9902 Segmental and somatic dysfunction of thoracic region: Secondary | ICD-10-CM | POA: Diagnosis not present

## 2022-12-21 DIAGNOSIS — M9901 Segmental and somatic dysfunction of cervical region: Secondary | ICD-10-CM | POA: Diagnosis not present

## 2022-12-21 DIAGNOSIS — M9903 Segmental and somatic dysfunction of lumbar region: Secondary | ICD-10-CM | POA: Diagnosis not present

## 2022-12-22 DIAGNOSIS — M9907 Segmental and somatic dysfunction of upper extremity: Secondary | ICD-10-CM | POA: Diagnosis not present

## 2022-12-22 DIAGNOSIS — M9901 Segmental and somatic dysfunction of cervical region: Secondary | ICD-10-CM | POA: Diagnosis not present

## 2022-12-22 DIAGNOSIS — M9902 Segmental and somatic dysfunction of thoracic region: Secondary | ICD-10-CM | POA: Diagnosis not present

## 2022-12-22 DIAGNOSIS — M9903 Segmental and somatic dysfunction of lumbar region: Secondary | ICD-10-CM | POA: Diagnosis not present

## 2023-01-10 DIAGNOSIS — R809 Proteinuria, unspecified: Secondary | ICD-10-CM | POA: Diagnosis not present

## 2023-01-10 DIAGNOSIS — I10 Essential (primary) hypertension: Secondary | ICD-10-CM | POA: Diagnosis not present

## 2023-01-10 DIAGNOSIS — N1831 Chronic kidney disease, stage 3a: Secondary | ICD-10-CM | POA: Diagnosis not present

## 2023-01-10 DIAGNOSIS — E785 Hyperlipidemia, unspecified: Secondary | ICD-10-CM | POA: Diagnosis not present

## 2023-01-10 DIAGNOSIS — R252 Cramp and spasm: Secondary | ICD-10-CM | POA: Diagnosis not present

## 2023-03-13 DIAGNOSIS — R252 Cramp and spasm: Secondary | ICD-10-CM | POA: Diagnosis not present

## 2023-03-13 DIAGNOSIS — I8312 Varicose veins of left lower extremity with inflammation: Secondary | ICD-10-CM | POA: Diagnosis not present

## 2023-03-13 DIAGNOSIS — R600 Localized edema: Secondary | ICD-10-CM | POA: Diagnosis not present

## 2023-03-13 DIAGNOSIS — I8311 Varicose veins of right lower extremity with inflammation: Secondary | ICD-10-CM | POA: Diagnosis not present

## 2023-03-13 DIAGNOSIS — I83893 Varicose veins of bilateral lower extremities with other complications: Secondary | ICD-10-CM | POA: Diagnosis not present

## 2023-05-12 ENCOUNTER — Encounter: Payer: Self-pay | Admitting: Family Medicine

## 2023-06-03 ENCOUNTER — Other Ambulatory Visit (HOSPITAL_COMMUNITY): Payer: Self-pay

## 2023-06-06 ENCOUNTER — Ambulatory Visit (INDEPENDENT_AMBULATORY_CARE_PROVIDER_SITE_OTHER): Payer: Medicare Other

## 2023-06-06 ENCOUNTER — Telehealth: Payer: Self-pay

## 2023-06-06 VITALS — Ht 70.0 in | Wt 222.0 lb

## 2023-06-06 DIAGNOSIS — Z0000 Encounter for general adult medical examination without abnormal findings: Secondary | ICD-10-CM

## 2023-06-06 NOTE — Addendum Note (Signed)
Addended by: Chalmers Cater on: 06/06/2023 10:04 AM   Modules accepted: Level of Service

## 2023-06-06 NOTE — Telephone Encounter (Signed)
Steven Ashley would like to go back on ED medication.

## 2023-06-06 NOTE — Progress Notes (Signed)
Subjective:   Steven Ashley is a 73 y.o. male who presents for Medicare Annual/Subsequent preventive examination.  Visit Complete: Virtual I connected with  [name] on 06/06/23 by a audio enabled telemedicine application and verified that I am speaking with the correct person using two identifiers.  Patient Location: Home  Provider Location: Office/Clinic  I discussed the limitations of evaluation and management by telemedicine. The patient expressed understanding and agreed to proceed.  Vital Signs: Because this visit was a virtual/telehealth visit, some criteria may be missing or patient reported. Any vitals not documented were not able to be obtained and vitals that have been documented are patient reported.  Patient Medicare AWV questionnaire was completed by the patient on 06/04/2023; I have confirmed that all information answered by patient is correct and no changes since this date.  Cardiac Risk Factors include: advanced age (>75men, >25 women);male gender;dyslipidemia;obesity (BMI >30kg/m2);hypertension     Objective:    Today's Vitals   06/06/23 0756  Weight: 222 lb (100.7 kg)  Height: 5\' 10"  (1.778 m)   Body mass index is 31.85 kg/m.     06/06/2023    8:10 AM 06/03/2022    8:15 AM 10/05/2021    8:28 PM 06/13/2017    8:14 AM  Advanced Directives  Does Patient Have a Medical Advance Directive? Yes Yes No Yes  Type of Estate agent of [place];Living will Living will  Healthcare Power of [place];Living will  Does patient want to make changes to medical advance directive? No - Patient declined No - Patient declined    Copy of Healthcare Power of Attorney in Chart? No - copy requested     Would patient like information on creating a medical advance directive?   No - Patient declined     Current Medications (verified) Outpatient Encounter Medications as of 06/06/2023  Medication Sig   Calcium Carb-Cholecalciferol (CALCIUM 500 + D) 500-5  MG-MCG TABS Take by mouth.   clobetasol (TEMOVATE) 0.05 % external solution Apply 1 Application topically 2 (two) times daily.   losartan (COZAAR) 25 MG tablet Take 1 tablet (25 mg total) by mouth daily.   MILK THISTLE PO Take by mouth.   mupirocin ointment (BACTROBAN) 2 % Apply to inside of each nares twice daily for 10 days then twice a week for maintenance.   Omega-3 Fatty Acids (FISH OIL) 1200 MG CAPS Take by mouth.   OPZELURA 1.5 % CREA Apply topically 2 (two) times daily.   Turmeric 1053 MG TABS Take by mouth.   [DISCONTINUED] amoxicillin-clavulanate (AUGMENTIN) 875-125 MG tablet Take 1 tablet by mouth 2 (two) times daily.   [DISCONTINUED] benzonatate (TESSALON) 100 MG capsule Take 1 capsule (100 mg total) by mouth 3 (three) times daily as needed for cough.   [DISCONTINUED] Iodine, Kelp, 0.15 MG TABS Take by mouth.   [DISCONTINUED] METAMUCIL FIBER PO Take by mouth.   [DISCONTINUED] Multiple Vitamin (MULTIVITAMIN ADULT PO) Take by mouth.   No facility-administered encounter medications on file as of 06/06/2023.    Allergies (verified) Tetracycline   History: Past Medical History:  Diagnosis Date   Arthritis    Cataract    Glaucoma    Past Surgical History:  Procedure Laterality Date   CERVICAL DISC SURGERY  2005   Family History  Problem Relation Age of Onset   Heart disease Mother    Coronary artery disease Mother        smoker   Dementia Mother  Diabetic kidney disease Mother    Parkinsonism Father        deceased   Benign prostatic hyperplasia Father        TURP   Diabetes Other    Colon cancer Neg Hx    Esophageal cancer Neg Hx    Liver cancer Neg Hx    Pancreatic cancer Neg Hx    Stomach cancer Neg Hx    Rectal cancer Neg Hx    Social History   Socioeconomic History   Marital status: Married    Spouse name: Tammy   Number of children: 4   Years of education: 14   Highest education level: Some college, no degree  Occupational History   Occupation:  Retired  Tobacco Use   Smoking status: Never   Smokeless tobacco: Never  Vaping Use   Vaping status: Never Used  Substance and Sexual Activity   Alcohol use: Yes    Alcohol/week: 0.0 standard drinks of alcohol    Comment: occasional drink   Drug use: No   Sexual activity: Yes    Partners: Female    Comment: retired Medical illustrator, 2 yrs college, married, 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  Other Topics Concern   Not on file  Social History Narrative   Lives with his wife. He has four children. He enjoys playing golf and fishing.   Social Drivers of Corporate investment banker Strain: Low Risk  (06/06/2023)   Overall Financial Resource Strain (CARDIA)    Difficulty of Paying Living Expenses: Not very hard  Food Insecurity: No Food Insecurity (06/06/2023)   Hunger Vital Sign    Worried About Running Out of Food in the Last Year: Never true    Ran Out of Food in the Last Year: Never true  Transportation Needs: No Transportation Needs (06/06/2023)   PRAPARE - Administrator, Civil Service (Medical): No    Lack of Transportation (Non-Medical): No  Physical Activity: Sufficiently Active (06/06/2023)   Exercise Vital Sign    Days of Exercise per Week: 5 days    Minutes of Exercise per Session: 50 min  Stress: Stress Concern Present (06/06/2023)   [employer] of Occupational Health - Occupational Stress Questionnaire    Feeling of Stress : To some extent  Social Connections: Moderately Integrated (06/06/2023)   Social Connection and Isolation Panel [NHANES]    Frequency of Communication with Friends and Family: More than three times a week    Frequency of Social Gatherings with Friends and Family: Three times a week    Attends Religious Services: More than 4 times per year    Active Member of Clubs or Organizations: No    Attends Banker Meetings: Never    Marital Status: Married    Tobacco Counseling Counseling given: Not Answered   Clinical Intake:  Pre-visit preparation  completed: Yes  Pain : No/denies pain     BMI - recorded: 31.85 Nutritional Status: BMI > 30  Obese Nutritional Risks: None Diabetes: No  What is the last grade level you completed in school?: 15  Interpreter Needed?: No      Activities of Daily Living    06/06/2023    7:58 AM 05/30/2023    5:07 PM  In your present state of health, do you have any difficulty performing the following activities:  Hearing? 0 0  Vision? 0 0  Difficulty concentrating or making decisions? 0 0  Walking or climbing stairs? 0 0  Dressing or bathing? 0 0  Doing errands, shopping? 0 0  Preparing Food and eating ? N N  Using the Toilet? N N  In the past six months, have you accidently leaked urine? N N  Do you have problems with loss of bowel control? N N  Managing your Medications? N N  Managing your Finances? N N  Housekeeping or managing your Housekeeping? N N    Patient Care Team: Agapito Games, MD as PCP - General  Indicate any recent Medical Services you may have received from other than [facility] providers in the past year (date may be approximate).     Assessment:   This is a routine wellness examination for Steven Ashley.  Hearing/Vision screen Hearing Screening - Comments:: Unable to test Vision Screening - Comments:: Unable to test.    Goals Addressed             This Visit's Progress    Weight (lb) < 200 lb (90.7 kg)   222 lb (100.7 kg)    He would like to lose 20 lbs by April.       Depression Screen    06/06/2023    8:07 AM 06/03/2022    8:16 AM 12/30/2021    8:17 AM 10/05/2021    2:43 PM 10/30/2015    4:28 PM  PHQ 2/9 Scores  PHQ - 2 Score 0 0 0 0 0    Fall Risk    06/06/2023    8:11 AM 05/30/2023    5:07 PM 06/03/2022    8:16 AM 12/30/2021    8:17 AM 10/05/2021    2:43 PM  Fall Risk   Falls in the past year? 1 0 1 1 1   Number falls in past yr: 0 0 0 0 0  Injury with Fall? 0 0 1 0 1  Risk for fall due to : No Fall Risks  History of fall(s) No Fall Risks Other  (Comment)  Follow up Falls evaluation completed  Falls evaluation completed;Education provided;Falls prevention discussed Falls evaluation completed Falls prevention discussed;Falls evaluation completed    MEDICARE RISK AT HOME: Medicare Risk at Home Any stairs in or around the home?: Yes If so, are there any without handrails?: No Home free of loose throw rugs in walkways, pet beds, electrical cords, etc?: Yes Adequate lighting in your home to reduce risk of falls?: Yes Life alert?: No Use of a cane, walker or w/c?: No Grab bars in the bathroom?: Yes Shower chair or bench in shower?: No Elevated toilet seat or a handicapped toilet?: No  TIMED UP AND GO:  Was the test performed?  No    Cognitive Function:        06/06/2023    8:12 AM 06/03/2022    8:35 AM  6CIT Screen  What Year? 0 points 0 points  What month? 0 points 0 points  What time? 0 points 0 points  Count back from 20 0 points 0 points  Months in reverse 0 points 0 points  Repeat phrase 0 points 0 points  Total Score 0 points 0 points    Immunizations Immunization History  Administered Date(s) Administered   Influenza Split 03/02/2011   Influenza Whole 03/04/2010   Influenza-Unspecified 01/31/2015   PNEUMOCOCCAL CONJUGATE-20 12/30/2021   Pneumococcal Conjugate-13 05/01/2015   Td 04/26/2007   Tdap 09/26/2021    TDAP status: Up to date  Flu Vaccine status: Due, Education has been provided regarding the importance of this vaccine. Advised may receive this vaccine at local pharmacy or Health  Dept. Aware to provide a copy of the vaccination record if obtained from local pharmacy or Health Dept. Verbalized acceptance and understanding.  Pneumococcal vaccine status: Up to date  Covid-19 vaccine status: Declined, Education has been provided regarding the importance of this vaccine but patient still declined. Advised may receive this vaccine at local pharmacy or Health Dept.or vaccine clinic. Aware to provide a  copy of the vaccination record if obtained from local pharmacy or Health Dept. Verbalized acceptance and understanding.  Qualifies for Shingles Vaccine? Yes   Zostavax completed No   Shingrix Completed?: No.    Education has been provided regarding the importance of this vaccine. Patient has been advised to call insurance company to determine out of pocket expense if they have not yet received this vaccine. Advised may also receive vaccine at local pharmacy or Health Dept. Verbalized acceptance and understanding.  Screening Tests Health Maintenance  Topic Date Due   Zoster Vaccines- Shingrix (1 of 2) Never done   INFLUENZA VACCINE  11/24/2022   COVID-19 Vaccine (1 - 2024-25 season) Never done   Medicare Annual Wellness (AWV)  06/05/2024   Colonoscopy  06/14/2027   DTaP/Tdap/Td (3 - Td or Tdap) 09/27/2031   Pneumonia Vaccine 30+ Years old  Completed   Hepatitis C Screening  Completed   HPV VACCINES  Aged Out    Health Maintenance  Health Maintenance Due  Topic Date Due   Zoster Vaccines- Shingrix (1 of 2) Never done   INFLUENZA VACCINE  11/24/2022   COVID-19 Vaccine (1 - 2024-25 season) Never done    Colorectal cancer screening: Type of screening: Colonoscopy. Completed 06/13/2017. Repeat every 10 years  Lung Cancer Screening: (Low Dose CT Chest recommended if Age 38-80 years, 20 pack-year currently smoking OR have quit w/in 15years.) does not qualify.   Lung Cancer Screening Referral: n/a  Additional Screening:  Hepatitis C Screening: does qualify; Completed 07/30/2012  Vision Screening: Recommended annual ophthalmology exams for early detection of glaucoma and other disorders of the eye. Is the patient up to date with their annual eye exam?  Yes  Who is the provider or what is the name of the office in which the patient attends annual eye exams? Dr Jackquline Bosch If pt is not established with a provider, would they like to be referred to a provider to establish care?  N/a .    Dental Screening: Recommended annual dental exams for proper oral hygiene   Community Resource Referral / Chronic Care Management: CRR required this visit?  No   CCM required this visit?  No     Plan:     I have personally reviewed and noted the following in the patient's chart:   Medical and social history Use of alcohol, tobacco or illicit drugs  Current medications and supplements including opioid prescriptions. Patient is not currently taking opioid prescriptions. Functional ability and status Nutritional status Physical activity Advanced directives List of other physicians Hospitalizations, surgeries, and ER visits in previous 12 months No Vitals Screenings to include cognitive, depression, and falls Referrals and appointments  In addition, I have reviewed and discussed with patient certain preventive protocols, quality metrics, and best practice recommendations. A written personalized care plan for preventive services as well as general preventive health recommendations were provided to patient.     Esmond Harps, CMA   06/06/2023   After Visit Summary: (MyChart) Due to this being a telephonic visit, the after visit summary with patients personalized plan was offered to patient via  MyChart   Nurse Notes:    Shawnn Bouillon is a 73 y.o. male patient of Metheney, Barbarann Ehlers, MD who had a Medicare Annual Wellness Visit today via telephone. He enjoys golfing and fishing.   Recommend Flu vaccine in office  Recommend Shingles vaccine at the pharmacy.   He declined future Covid vaccines.

## 2023-06-06 NOTE — Patient Instructions (Signed)
  Steven Ashley , Thank you for taking time to come for your Medicare Wellness Visit. I appreciate your ongoing commitment to your health goals. Please review the following plan we discussed and let me know if I can assist you in the future.   These are the goals we discussed:  Goals       Patient Stated (pt-stated)      Patient would like to continue losing weight. He would like to loose about 30 more lbs.      Weight (lb) < 200 lb (90.7 kg)      He would like to lose 20 lbs by April.         This is a list of the screening recommended for you and due dates:  Health Maintenance  Topic Date Due   Zoster (Shingles) Vaccine (1 of 2) Never done   Flu Shot  11/24/2022   COVID-19 Vaccine (1 - 2024-25 season) Never done   Medicare Annual Wellness Visit  06/05/2024   Colon Cancer Screening  06/14/2027   DTaP/Tdap/Td vaccine (3 - Td or Tdap) 09/27/2031   Pneumonia Vaccine  Completed   Hepatitis C Screening  Completed   HPV Vaccine  Aged Out

## 2023-06-07 NOTE — Telephone Encounter (Signed)
Whic one did he prefer. I have a couple dif ones on his med list

## 2023-06-08 NOTE — Telephone Encounter (Signed)
He states he doesn't have a preference. He just wants the safest medication.

## 2023-06-09 MED ORDER — TADALAFIL 20 MG PO TABS: 10.0000 mg | ORAL_TABLET | ORAL | 6 refills | Status: DC | PRN

## 2023-06-09 NOTE — Telephone Encounter (Signed)
Meds ordered this encounter  Medications   tadalafil (CIALIS) 20 MG tablet    Sig: Take 0.5-1 tablets (10-20 mg total) by mouth every other day as needed for erectile dysfunction.    Dispense:  10 tablet    Refill:  6

## 2023-06-09 NOTE — Telephone Encounter (Signed)
Patient informed.

## 2023-06-10 IMAGING — US US EXTREM LOW VENOUS*L*
1 series · 14 of 24 positions shown · non-contrast
Comparison: None Available.

CLINICAL DATA: Left leg bruising and swelling

EXAM:
LEFT LOWER EXTREMITY VENOUS DOPPLER ULTRASOUND
TECHNIQUE: Gray-scale sonography with compression, as well as color and duplex
ultrasound, were performed to evaluate the deep venous system(s)
from the level of the common femoral vein through the popliteal and
proximal calf veins.

[Series 1: us venous img lower uni left (dvt) · portal-venous · 14 of 32 slices shown]
[im 1/32]
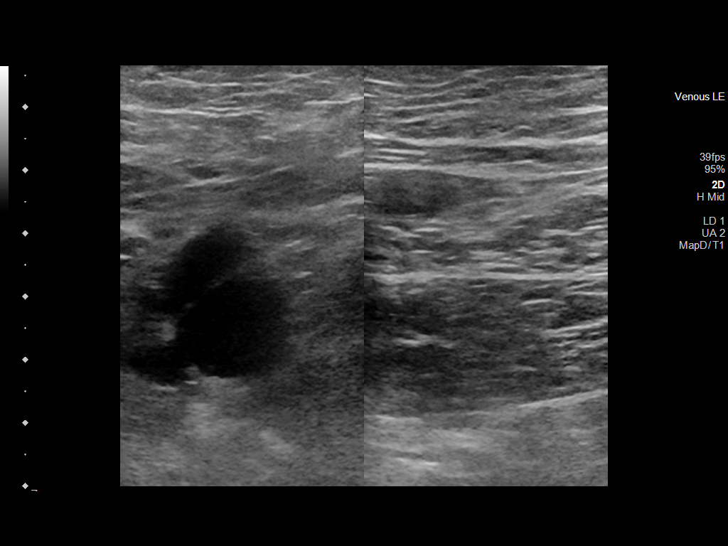
[im 3/32]
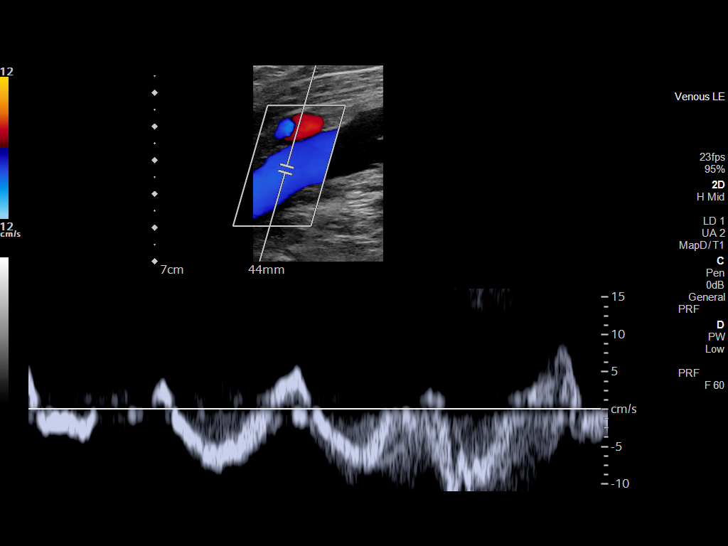
[im 6/32]
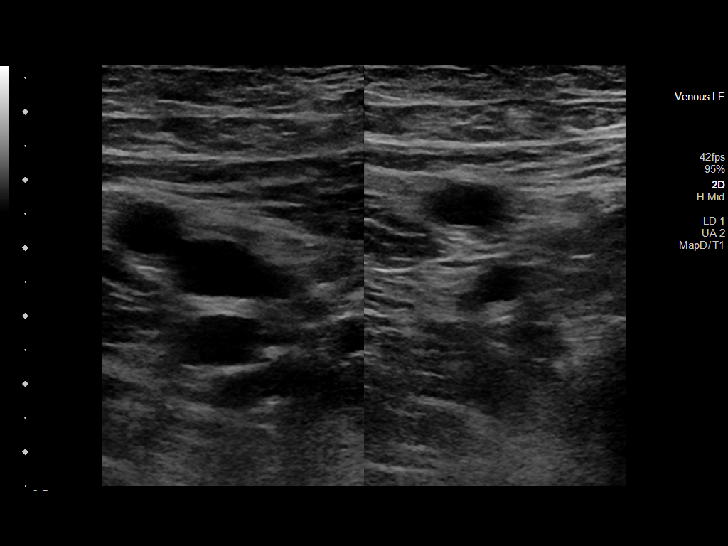
[im 9/32]
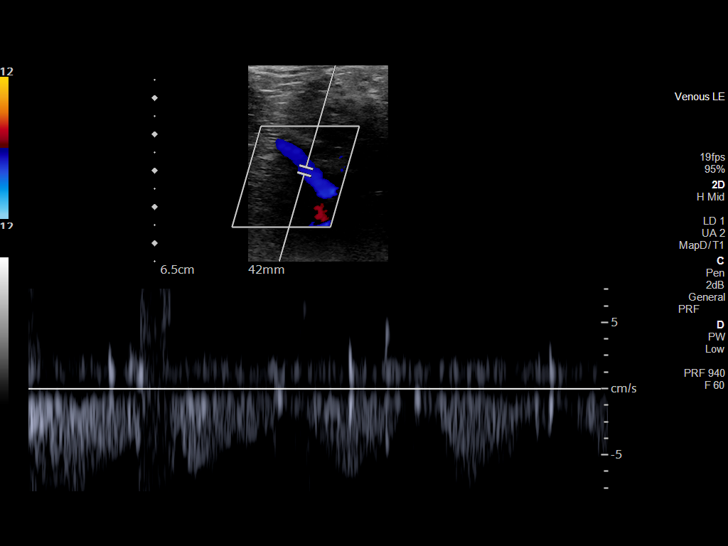
[im 10/32]
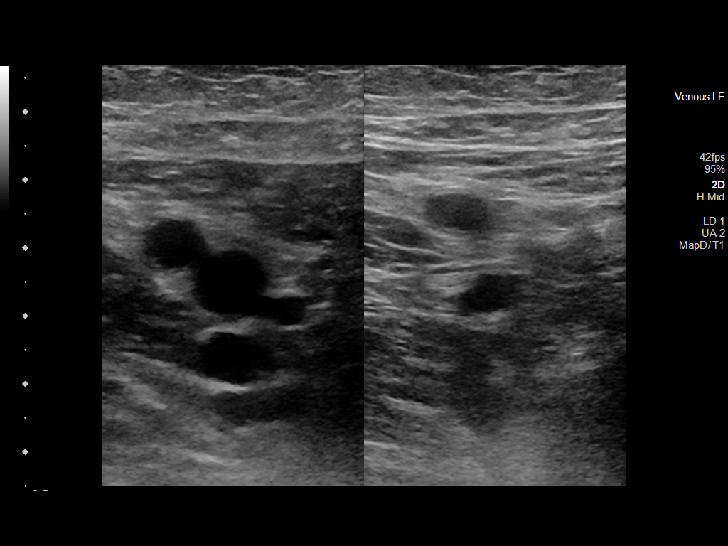
[im 13/32]
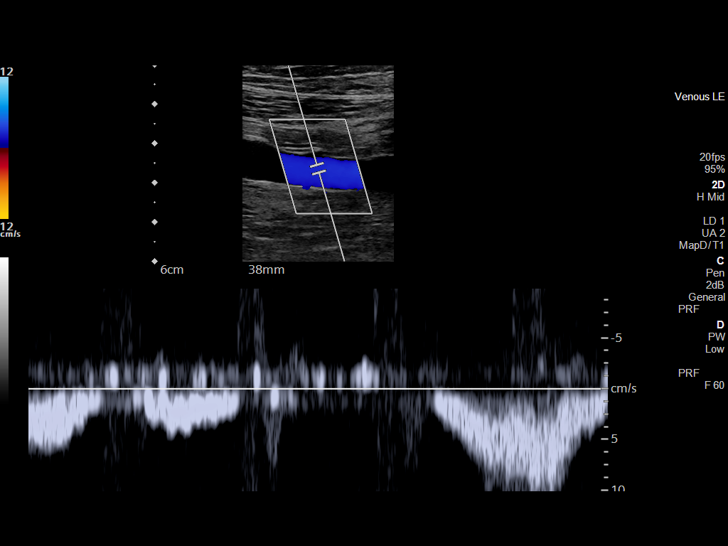
[im 15/32]
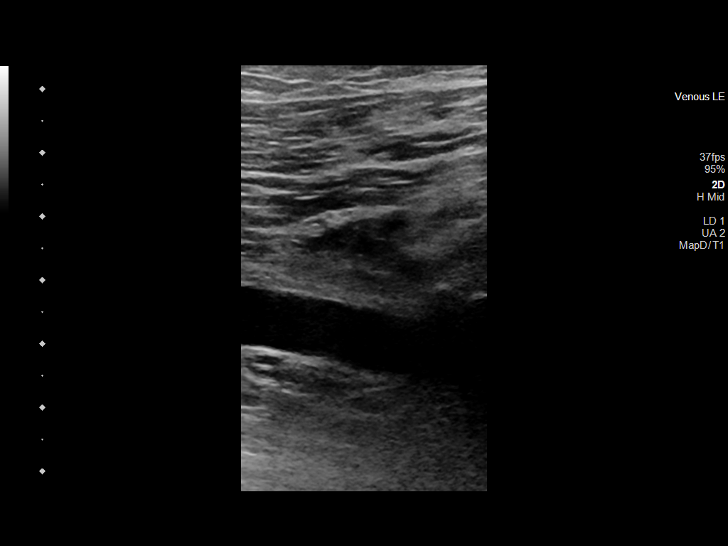
[im 17/32]
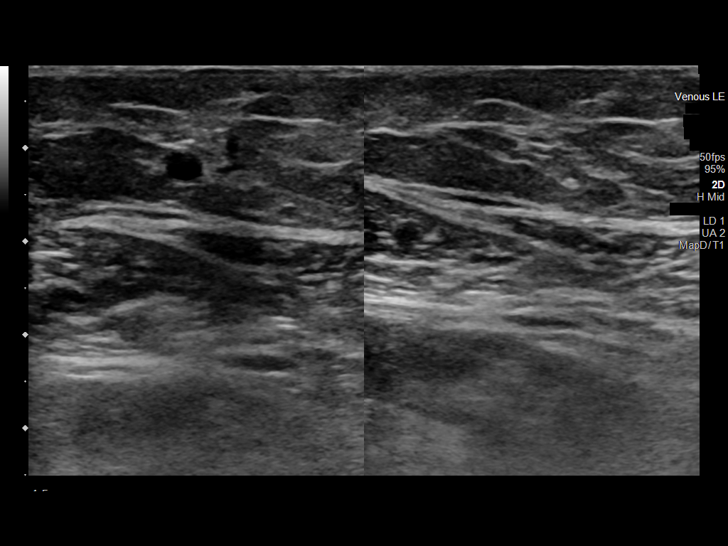
[im 19/32]
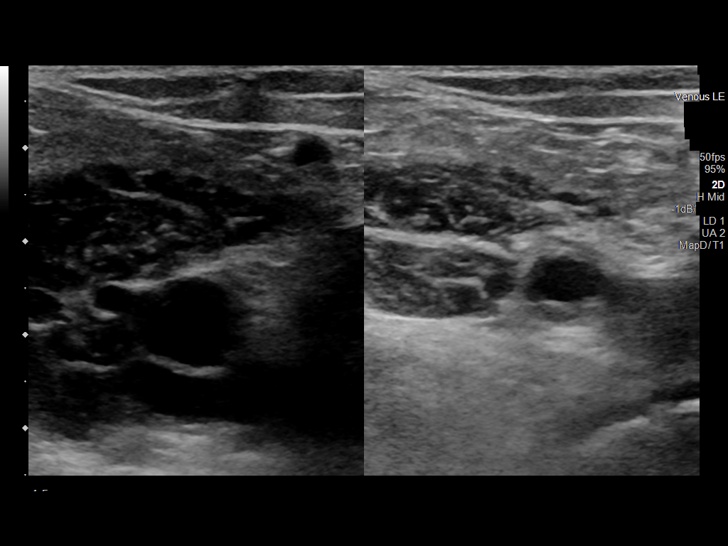
[im 22/32]
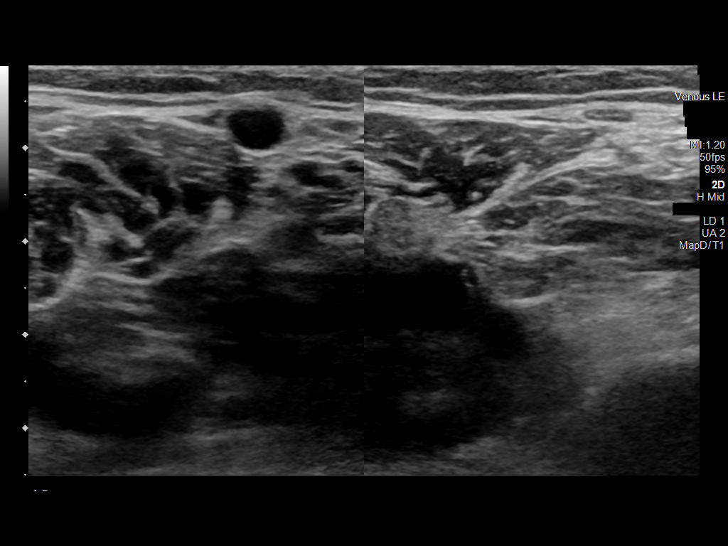
[im 25/32]
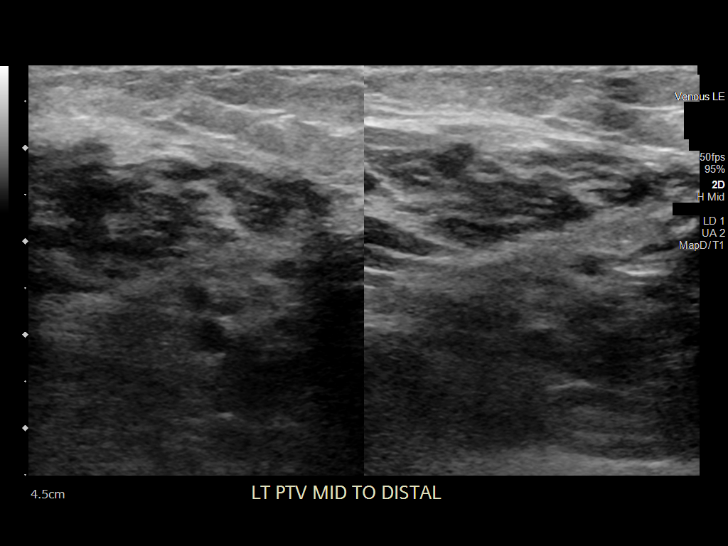
[im 26/32]
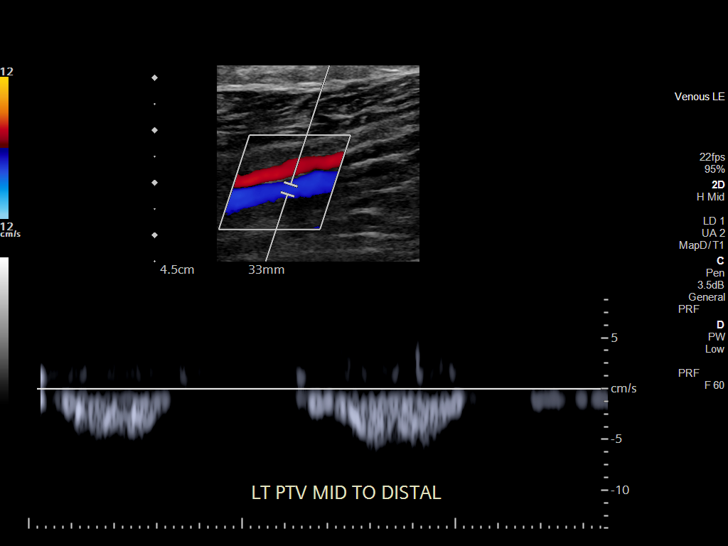
[im 29/32]
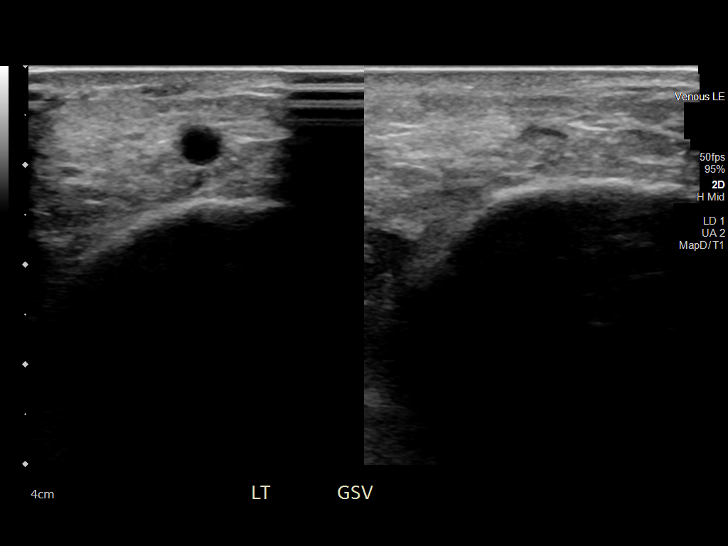
[im 32/32]
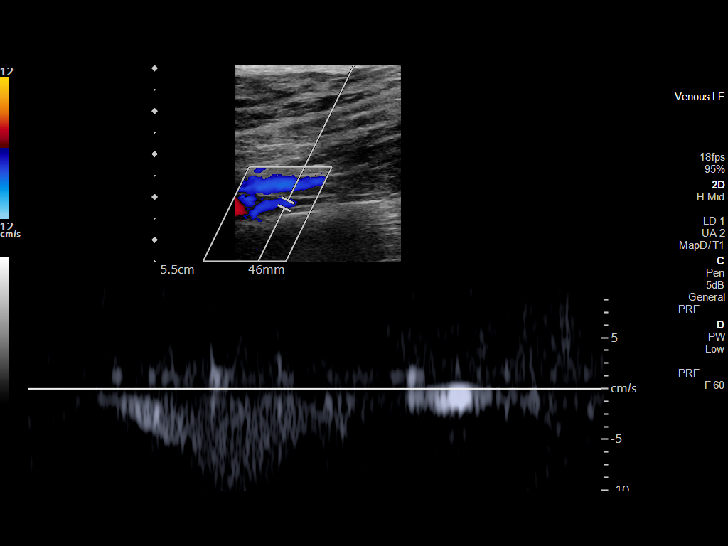

[14 of 24 positions shown; findings below may reference images not displayed]

FINDINGS: VENOUS

Normal compressibility of the common femoral, superficial femoral,
and popliteal veins, as well as the visualized calf veins.
Visualized portions of profunda femoral vein and great saphenous
vein unremarkable. No filling defects to suggest DVT on grayscale or
color Doppler imaging. Doppler waveforms show normal direction of
venous flow, normal respiratory plasticity and response to
augmentation.

Limited views of the contralateral common femoral vein are
unremarkable.

OTHER

None.

Limitations: none
IMPRESSION: Negative.

## 2023-07-21 ENCOUNTER — Other Ambulatory Visit: Payer: Self-pay | Admitting: Family Medicine

## 2023-07-21 DIAGNOSIS — R058 Other specified cough: Secondary | ICD-10-CM

## 2023-08-02 ENCOUNTER — Encounter: Payer: Self-pay | Admitting: Family Medicine

## 2023-08-02 ENCOUNTER — Ambulatory Visit (INDEPENDENT_AMBULATORY_CARE_PROVIDER_SITE_OTHER): Admitting: Family Medicine

## 2023-08-02 VITALS — BP 128/84 | HR 58 | Ht 70.0 in | Wt 230.0 lb

## 2023-08-02 DIAGNOSIS — L255 Unspecified contact dermatitis due to plants, except food: Secondary | ICD-10-CM | POA: Diagnosis not present

## 2023-08-02 MED ORDER — PREDNISONE 10 MG (48) PO TBPK: ORAL_TABLET | ORAL | 0 refills | Status: DC

## 2023-08-02 MED ORDER — CEPHALEXIN 500 MG PO CAPS: 500.0000 mg | ORAL_CAPSULE | Freq: Four times a day (QID) | ORAL | 0 refills | Status: AC

## 2023-08-02 NOTE — Progress Notes (Signed)
 [place] - 73 y.o. male MRN [id number]  Date of birth: 20-Jul-1950  Subjective Chief Complaint  Patient presents with   Poison Ivy    HPI Steven Ashley is a 73 y.o. male here today with complaint of rash on the R arm, abdomen and L leg.  He was moving some brush over the past weekend and work up with itchy, stinging rash.  He has tried OTC remedies without relief.  He has some increase erythema of the area on the R arm and a little more pain over the past day.  Denies increased drainage, fever, chills.    ROS:  A comprehensive ROS was completed and negative except as noted per HPI  Allergies  Allergen Reactions   Tetracycline     REACTION: Rash    Past Medical History:  Diagnosis Date   Arthritis    Cataract    Glaucoma     Past Surgical History:  Procedure Laterality Date   CERVICAL DISC SURGERY  2005    Social History   Socioeconomic History   Marital status: Married    Spouse name: Tammy   Number of children: 4   Years of education: 14   Highest education level: Some college, no degree  Occupational History   Occupation: Retired  Tobacco Use   Smoking status: Never   Smokeless tobacco: Never  Vaping Use   Vaping status: Never Used  Substance and Sexual Activity   Alcohol use: Yes    Alcohol/week: 0.0 standard drinks of alcohol    Comment: occasional drink   Drug use: No   Sexual activity: Yes    Partners: Female    Comment: retired Medical illustrator, 2 yrs college, married, 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  Other Topics Concern   Not on file  Social History Narrative   Lives with his wife. He has four children. He enjoys playing golf and fishing.   Social Drivers of Corporate investment banker Strain: Low Risk  (06/06/2023)   Overall Financial Resource Strain (CARDIA)    Difficulty of Paying Living Expenses: Not very hard  Food Insecurity: No Food Insecurity (06/06/2023)   Hunger Vital Sign    Worried About Running Out of Food in the Last Year: Never true    Ran Out of Food  in the Last Year: Never true  Transportation Needs: No Transportation Needs (06/06/2023)   PRAPARE - Administrator, Civil Service (Medical): No    Lack of Transportation (Non-Medical): No  Physical Activity: Sufficiently Active (06/06/2023)   Exercise Vital Sign    Days of Exercise per Week: 5 days    Minutes of Exercise per Session: 50 min  Stress: Stress Concern Present (06/06/2023)   [employer] of Occupational Health - Occupational Stress Questionnaire    Feeling of Stress : To some extent  Social Connections: Moderately Integrated (06/06/2023)   Social Connection and Isolation Panel [NHANES]    Frequency of Communication with Friends and Family: More than three times a week    Frequency of Social Gatherings with Friends and Family: Three times a week    Attends Religious Services: More than 4 times per year    Active Member of Clubs or Organizations: No    Attends Banker Meetings: Never    Marital Status: Married    Family History  Problem Relation Age of Onset   Heart disease Mother    Coronary artery disease Mother        smoker  Dementia Mother    Diabetic kidney disease Mother    Parkinsonism Father        deceased   Benign prostatic hyperplasia Father        TURP   Diabetes Other    Colon cancer Neg Hx    Esophageal cancer Neg Hx    Liver cancer Neg Hx    Pancreatic cancer Neg Hx    Stomach cancer Neg Hx    Rectal cancer Neg Hx     Health Maintenance  Topic Date Due   Zoster Vaccines- Shingrix (1 of 2) Never done   COVID-19 Vaccine (1 - 2024-25 season) Never done   INFLUENZA VACCINE  11/24/2023   Medicare Annual Wellness (AWV)  06/05/2024   Colonoscopy  06/14/2027   DTaP/Tdap/Td (3 - Td or Tdap) 09/27/2031   Pneumonia Vaccine 87+ Years old  Completed   Hepatitis C Screening  Completed   HPV VACCINES  Aged Out      ----------------------------------------------------------------------------------------------------------------------------------------------------------------------------------------------------------------- Physical Exam BP (!) 177/80 (BP Location: Left Arm, Patient Position: Sitting, Cuff Size: Large)   Pulse (!) 58   Ht 5\' 10"  (1.778 m)   Wt 230 lb (104.3 kg)   SpO2 95%   BMI 33.00 kg/m   Physical Exam Constitutional:      Appearance: Normal appearance.  HENT:     Head: Normocephalic and atraumatic.  Eyes:     General: No scleral icterus. Musculoskeletal:     Cervical back: Neck supple.  Skin:    Comments: Slightly raised erythematous patch on the R arm with warmth.  Mildly tender.   Several slightly raised linear erythematous lesions on the R abdominal wall and L posterior leg.   Neurological:     Mental Status: He is alert.     ------------------------------------------------------------------------------------------------------------------------------------------------------------------------------------------------------------------- Assessment and Plan  Rhus dermatitis Rash consistent with contact with urushiol containing plant.  Will add 12 days steroid taper.  Increased erythema of area on R arm.  Will cover for cellulitis with keflex x7 days.  Red flags reviewed.  Return to clinic if worsening.    Meds ordered this encounter  Medications   predniSONE (STERAPRED UNI-PAK 48 TAB) 10 MG (48) TBPK tablet    Sig: Taper as directed on packaging.    Dispense:  48 tablet    Refill:  0   cephALEXin (KEFLEX) 500 MG capsule    Sig: Take 1 capsule (500 mg total) by mouth 4 (four) times daily for 7 days.    Dispense:  28 capsule    Refill:  0    No follow-ups on file.

## 2023-08-02 NOTE — Patient Instructions (Signed)
Poison Oak Dermatitis  Poison oak dermatitis is irritation and swelling (inflammation) of the skin. This is caused by chemicals in the leaves of the poison oak plant. You may have very bad itching, swelling, redness, a rash, and blisters. What are the causes? You may get this condition by: Touching a poison oak plant. Touching something that has the chemical from the leaves on it. This may include animals or objects that have come in contact with the plant. What increases the risk? You are more likely to get this condition if you: Go outdoors often in wooded or marshy areas. Go outdoors without wearing protective clothing, such as closed shoes, long pants, and a long-sleeved shirt. What are the signs or symptoms? Symptoms of this condition include: Redness of the skin. Very bad itching. A rash that often includes bumps and blisters. The rash usually appears 48 hours after exposure if you have been exposed before. If this is the first time you have been exposed, the rash may not appear until a week after exposure. Swelling. This may occur if the reaction is very bad. Symptoms often clear up in 1-2 weeks. The first time you get this condition, symptoms may last 3-4 weeks. How is this treated? This condition may be treated with: Hydrocortisone creams or calamine lotions to help with itching. Oatmeal baths to soothe the skin. Medicines to help reduce itching (antihistamines). If you have a very bad reaction, you may also be given steroid medicines. Follow these instructions at home: Medicines Take or apply over-the-counter and prescription medicines only as told by your doctor. Use hydrocortisone creams or calamine lotion as needed to help with itching. General instructions Do not scratch or rub your skin. Put a cold, wet cloth (cold compress) on the affected areas or take baths in cool water. This will help with itching. Avoid hot baths and showers. Take oatmeal baths as needed. Use  colloidal oatmeal. You can get this at a pharmacy or grocery store. Follow the instructions on the package. While you have the rash, wash your clothes right after you wear them. Check the affected area every day for signs of infection. Check for: More redness, swelling, or pain. Fluid or blood. Warmth. Pus or a bad smell. Keep all follow-up visits. Your doctor may want to see how your skin is progressing with treatment. How is this prevented?  Know what poison oak looks like so you can avoid it. This plant has three leaves with flowering branches on a single stem. The leaves are fuzzy. The edges of the leaves look like teeth. If you have touched poison oak, wash your skin with soap and water right away. Be sure to wash under your fingernails. When hiking or camping, wear long pants, a long-sleeved shirt, long socks, and hiking boots. You can also use a lotion on your skin that helps prevent contact with the chemical on the plant. If you think that your clothes or outdoor gear came in contact with poison oak, rinse them off with a garden hose before you bring them inside your house. When doing yard work or gardening, wear gloves, long sleeves, long pants, and boots. Wash your garden tools and gloves if they come in contact with poison oak. If you think that your pet has come into contact with poison oak, wash them with pet shampoo and water. Make sure you wear gloves while washing your pet. Do not burn poison oak plants. This can release the chemical from the plant into the air and  may cause a reaction. Contact a doctor if: You have open sores in the rash area. You have any signs of infection. You have redness that spreads beyond the rash area. You have a fever. You have a rash over a large area of your body. You have a rash on your eyes, mouth, or genitals. Your rash does not improve after a few weeks. Get help right away if: Your face swells or your eyes swell shut. You have trouble  breathing. You have trouble swallowing. These symptoms may be an emergency. Do not wait to see if the symptoms will go away. Get help right away. Call 911. This information is not intended to replace advice given to you by your health care provider. Make sure you discuss any questions you have with your health care provider. Document Revised: 09/09/2021 Document Reviewed: 09/09/2021 Elsevier Patient Education  2024 [employer].

## 2023-08-02 NOTE — Assessment & Plan Note (Signed)
 Rash consistent with contact with urushiol containing plant.  Will add 12 days steroid taper.  Increased erythema of area on R arm.  Will cover for cellulitis with keflex x7 days.  Red flags reviewed.  Return to clinic if worsening.

## 2023-08-21 ENCOUNTER — Ambulatory Visit: Payer: Self-pay

## 2023-08-21 NOTE — Telephone Encounter (Signed)
    Chief Complaint: Urinary frequency, muscle cramping Symptoms: Above Frequency: 2 weeks Pertinent Negatives: Patient denies  Disposition: [] ED /[] Urgent Care (no appt availability in office) / [x] Appointment(In office/virtual)/ []  [facility]/ [] Home Care/ [] Refused Recommended Disposition /[] [facility]/ []  Follow-up with PCP Additional Notes: Agrees with appointment.  Reason for Disposition  Urinating more frequently than usual (i.e., frequency)  Answer Assessment - Initial Assessment Questions 1. SYMPTOM: "What's the main symptom you're concerned about?" (e.g., frequency, incontinence)     Frequency 2. ONSET: "When did the  =  start?"     2 weeks 3. PAIN: "Is there any pain?" If Yes, ask: "How bad is it?" (Scale: 1-10; mild, moderate, severe)     No 4. CAUSE: "What do you think is causing the symptoms?"     Unsure 5. OTHER SYMPTOMS: "Do you have any other symptoms?" (e.g., blood in urine, fever, flank pain, pain with urination)     No 6. PREGNANCY: "Is there any chance you are pregnant?" "When was your last menstrual period?"     N/a  Protocols used: Urinary Symptoms-A-AH

## 2023-08-21 NOTE — Telephone Encounter (Signed)
 Reason for Triage: frequent cramping, frequent urination

## 2023-08-22 ENCOUNTER — Ambulatory Visit (INDEPENDENT_AMBULATORY_CARE_PROVIDER_SITE_OTHER): Admitting: Family Medicine

## 2023-08-22 VITALS — BP 130/82 | HR 58 | Ht 70.0 in | Wt 227.0 lb

## 2023-08-22 DIAGNOSIS — R252 Cramp and spasm: Secondary | ICD-10-CM | POA: Diagnosis not present

## 2023-08-22 DIAGNOSIS — D649 Anemia, unspecified: Secondary | ICD-10-CM | POA: Diagnosis not present

## 2023-08-22 DIAGNOSIS — N529 Male erectile dysfunction, unspecified: Secondary | ICD-10-CM | POA: Diagnosis not present

## 2023-08-22 DIAGNOSIS — N401 Enlarged prostate with lower urinary tract symptoms: Secondary | ICD-10-CM | POA: Diagnosis not present

## 2023-08-22 DIAGNOSIS — R350 Frequency of micturition: Secondary | ICD-10-CM

## 2023-08-22 DIAGNOSIS — R351 Nocturia: Secondary | ICD-10-CM | POA: Diagnosis not present

## 2023-08-22 LAB — POCT URINALYSIS DIP (CLINITEK)
Bilirubin, UA: NEGATIVE
Blood, UA: NEGATIVE
Glucose, UA: NEGATIVE mg/dL
Ketones, POC UA: NEGATIVE mg/dL
Leukocytes, UA: NEGATIVE
Nitrite, UA: NEGATIVE
POC PROTEIN,UA: NEGATIVE
Spec Grav, UA: <=1.005 — AB (ref 1.010–1.025)
Urobilinogen, UA: 0.2 U/dL
pH, UA: 5.5 (ref 5.0–8.0)

## 2023-08-22 MED ORDER — FINASTERIDE 5 MG PO TABS: 5.0000 mg | ORAL_TABLET | Freq: Every day | ORAL | 3 refills | Status: AC

## 2023-08-22 MED ORDER — TAMSULOSIN HCL 0.4 MG PO CAPS: 0.4000 mg | ORAL_CAPSULE | Freq: Every day | ORAL | 3 refills | Status: AC

## 2023-08-22 NOTE — Progress Notes (Addendum)
 Acute Office Visit  Subjective:     Patient ID: Steven Ashley, male    DOB: 10-Apr-1951, 73 y.o.   MRN: [id number]  Chief Complaint  Patient presents with   Urinary Frequency    HPI Patient is in today for urinary frequency x 2 months. Hx of BPH.  No change in urine color no dysuria.  It is more of a dominantly affecting him at night he states he is getting up to urinate almost every hour he says at a minimum 3 times a night but most nights 5-6 times.  He says he has been hydrating better since we last did labs.  Muscle cramping in calves, ankles.  Worse after working in yard or activity.    He has also been unde a lot of stress, son who has 72 has MS and is not doing well it seems to be progressing rapidly and he is not seeking treatment.   ROS      Objective:    BP 130/82 (BP Location: Left Arm, Cuff Size: Large)   Pulse (!) 58   Ht 5\' 10"  (1.778 m)   Wt 227 lb (103 kg)   SpO2 99%   BMI 32.57 kg/m    Physical Exam Vitals and nursing note reviewed. Exam conducted with a chaperone present.  Constitutional:      Appearance: Normal appearance.  HENT:     Head: Normocephalic and atraumatic.  Eyes:     Conjunctiva/sclera: Conjunctivae normal.  Cardiovascular:     Rate and Rhythm: Normal rate and regular rhythm.  Pulmonary:     Effort: Pulmonary effort is normal.     Breath sounds: Normal breath sounds.  Genitourinary:    Pubic Area: No rash.      Epididymis:     Right: Normal.     Left: Normal.     Prostate: Enlarged. Not tender and no nodules present.     Comments: Grade 3 enlargement of the gland.  The asymmetric being a little larger on the right compared to the left. Skin:    General: Skin is warm and dry.  Neurological:     Mental Status: He is alert.  Psychiatric:        Mood and Affect: Mood normal.     Results for orders placed or performed in visit on 08/22/23  PSA  Result Value Ref Range   Prostate Specific Ag, Serum 4.5 (H) 0.0 - 4.0 ng/mL   CMP14+EGFR  Result Value Ref Range   Glucose 83 70 - 99 mg/dL   BUN 18 8 - 27 mg/dL   Creatinine, Ser 1.19 0.76 - 1.27 mg/dL   eGFR 68 >14 NW/GNF/6.21   BUN/Creatinine Ratio 16 10 - 24   Sodium 140 134 - 144 mmol/L   Potassium 4.9 3.5 - 5.2 mmol/L   Chloride 104 96 - 106 mmol/L   CO2 24 20 - 29 mmol/L   Calcium 9.4 8.6 - 10.2 mg/dL   Total Protein 6.4 6.0 - 8.5 g/dL   Albumin 4.4 3.8 - 4.8 g/dL   Globulin, Total 2.0 1.5 - 4.5 g/dL   Bilirubin Total 0.2 0.0 - 1.2 mg/dL   Alkaline Phosphatase 57 44 - 121 IU/L   AST 23 0 - 40 IU/L   ALT 28 0 - 44 IU/L  Lipid Panel With LDL/HDL Ratio  Result Value Ref Range   Cholesterol, Total 229 (H) 100 - 199 mg/dL   Triglycerides 308 (H) 0 - 149 mg/dL  HDL 54 >39 mg/dL   VLDL Cholesterol Cal 27 5 - 40 mg/dL   LDL Chol Calc (NIH) 323 (H) 0 - 99 mg/dL   LDL/HDL Ratio 2.7 0.0 - 3.6 ratio  CBC  Result Value Ref Range   WBC 6.5 3.4 - 10.8 x10E3/uL   RBC 4.51 4.14 - 5.80 x10E6/uL   Hemoglobin 13.9 13.0 - 17.7 g/dL   Hematocrit 55.7 32.2 - 51.0 %   MCV 92 79 - 97 fL   MCH 30.8 26.6 - 33.0 pg   MCHC 33.5 31.5 - 35.7 g/dL   RDW 02.5 42.7 - 06.2 %   Platelets 166 150 - 450 x10E3/uL  Magnesium  Result Value Ref Range   Magnesium 2.0 1.6 - 2.3 mg/dL  CK (Creatine Kinase)  Result Value Ref Range   Total CK 145 41 - 331 U/L  Fe+TIBC+Fer  Result Value Ref Range   Total Iron Binding Capacity 239 (L) 250 - 450 ug/dL   UIBC 376 283 - 151 ug/dL   Iron 40 38 - 761 ug/dL   Iron Saturation 17 15 - 55 %   Ferritin 634 (H) 30 - 400 ng/mL  POCT URINALYSIS DIP (CLINITEK)  Result Value Ref Range   Color, UA yellow yellow   Clarity, UA clear clear   Glucose, UA negative negative mg/dL   Bilirubin, UA negative negative   Ketones, POC UA negative negative mg/dL   Spec Grav, UA <=6.073 (A) 1.010 - 1.025   Blood, UA negative negative   pH, UA 5.5 5.0 - 8.0   POC PROTEIN,UA negative negative, trace   Urobilinogen, UA 0.2 0.2 or 1.0 E.U./dL   Nitrite,  UA Negative Negative   Leukocytes, UA Negative Negative    RO-AUA SYMPTOM     Row Name 08/22/23 1500         During the last Month   Sensation of Bladder not Empty Almost Always     Urinate<2 hours after last More than half the time     Mult. stop/start when voiding Less than 1 time in 5     Difficult to postpone voiding About half the time     Weak urinary stream More than half the time     Push/strain to begin urination Less than 1 time in 5     Times per night up to urinate About half the time       OTHER   Total Score 21                  Assessment & Plan:   Problem List Items Addressed This Visit       Genitourinary   BPH (benign prostatic hyperplasia)   02/2011: AUA score 8 (moderate)  12/2021: AUA score of 23  07/2023:  AUA score 21  He has a significantly enlarged prostate gland it is a little bit asymmetric being larger on the right compared to the left but no discrete nodules and no induration.  We discussed starting dual treatment with finasteride  and Flomax .  I would like to see him back in 4 to 6 months to make sure that symptoms are improving and repeat his a UA.  Did have a discussion that the prostate can cause such significant obstruction that it can cause patient to end up in the emergency department with a catheter if not treated.  Interestingly his father had to have prostate surgery.      Relevant Medications   finasteride  (PROSCAR ) 5 MG tablet  tamsulosin  (FLOMAX ) 0.4 MG CAPS capsule   Other Relevant Orders   PSA (Completed)   CMP14+EGFR (Completed)   Lipid Panel With LDL/HDL Ratio (Completed)   CBC (Completed)     Other   ED (erectile dysfunction)   Also completed ED questionnaire today with score of 13.  Testosterone  questionnaire score of 2.  Okay to continue tadalafil  as needed.      Other Visit Diagnoses       Urinary frequency    -  Primary   Relevant Orders   POCT URINALYSIS DIP (CLINITEK) (Completed)   PSA (Completed)    CMP14+EGFR (Completed)   Lipid Panel With LDL/HDL Ratio (Completed)   CBC (Completed)     Muscle cramping       Relevant Orders   PSA (Completed)   CMP14+EGFR (Completed)   Lipid Panel With LDL/HDL Ratio (Completed)   CBC (Completed)   Magnesium (Completed)   CK (Creatine Kinase) (Completed)   Fe+TIBC+Fer (Completed)      Cramping-unclear etiology will do some additional labs to work that up further it definitely seems to be triggered with activity.     Meds ordered this encounter  Medications   finasteride  (PROSCAR ) 5 MG tablet    Sig: Take 1 tablet (5 mg total) by mouth daily.    Dispense:  90 tablet    Refill:  3   tamsulosin  (FLOMAX ) 0.4 MG CAPS capsule    Sig: Take 1 capsule (0.4 mg total) by mouth daily.    Dispense:  90 capsule    Refill:  3    Return if symptoms worsen or fail to improve.  Duaine German, MD

## 2023-08-22 NOTE — Assessment & Plan Note (Addendum)
 02/2011: AUA score 8 (moderate)  12/2021: AUA score of 23  07/2023:  AUA score 21  He has a significantly enlarged prostate gland it is a little bit asymmetric being larger on the right compared to the left but no discrete nodules and no induration.  We discussed starting dual treatment with finasteride and Flomax.  I would like to see him back in 4 to 6 months to make sure that symptoms are improving and repeat his a UA.  Did have a discussion that the prostate can cause such significant obstruction that it can cause patient to end up in the emergency department with a catheter if not treated.  Interestingly his father had to have prostate surgery.

## 2023-08-23 ENCOUNTER — Encounter: Payer: Self-pay | Admitting: Family Medicine

## 2023-08-23 LAB — CMP14+EGFR
ALT: 28 IU/L (ref 0–44)
AST: 23 IU/L (ref 0–40)
Albumin: 4.4 g/dL (ref 3.8–4.8)
Alkaline Phosphatase: 57 IU/L (ref 44–121)
BUN/Creatinine Ratio: 16 (ref 10–24)
BUN: 18 mg/dL (ref 8–27)
Bilirubin Total: 0.2 mg/dL (ref 0.0–1.2)
CO2: 24 mmol/L (ref 20–29)
Calcium: 9.4 mg/dL (ref 8.6–10.2)
Chloride: 104 mmol/L (ref 96–106)
Creatinine, Ser: 1.13 mg/dL (ref 0.76–1.27)
Globulin, Total: 2 g/dL (ref 1.5–4.5)
Glucose: 83 mg/dL (ref 70–99)
Potassium: 4.9 mmol/L (ref 3.5–5.2)
Sodium: 140 mmol/L (ref 134–144)
Total Protein: 6.4 g/dL (ref 6.0–8.5)
eGFR: 68 mL/min/{1.73_m2} (ref >59)

## 2023-08-23 LAB — CBC
Hematocrit: 41.5 % (ref 37.5–51.0)
Hemoglobin: 13.9 g/dL (ref 13.0–17.7)
MCH: 30.8 pg (ref 26.6–33.0)
MCHC: 33.5 g/dL (ref 31.5–35.7)
MCV: 92 fL (ref 79–97)
Platelets: 166 10*3/uL (ref 150–450)
RBC: 4.51 x10E6/uL (ref 4.14–5.80)
RDW: 13.2 % (ref 11.6–15.4)
WBC: 6.5 10*3/uL (ref 3.4–10.8)

## 2023-08-23 LAB — MAGNESIUM: Magnesium: 2 mg/dL (ref 1.6–2.3)

## 2023-08-23 LAB — IRON,TIBC AND FERRITIN PANEL
Ferritin: 634 ng/mL — ABNORMAL HIGH (ref 30–400)
Iron Saturation: 17 % (ref 15–55)
Iron: 40 ug/dL (ref 38–169)
Total Iron Binding Capacity: 239 ug/dL — ABNORMAL LOW (ref 250–450)
UIBC: 199 ug/dL (ref 111–343)

## 2023-08-23 LAB — CK: Total CK: 145 U/L (ref 41–331)

## 2023-08-23 LAB — LIPID PANEL WITH LDL/HDL RATIO
Cholesterol, Total: 229 mg/dL — ABNORMAL HIGH (ref 100–199)
HDL: 54 mg/dL (ref >39)
LDL Chol Calc (NIH): 148 mg/dL — ABNORMAL HIGH (ref 0–99)
LDL/HDL Ratio: 2.7 ratio (ref 0.0–3.6)
Triglycerides: 152 mg/dL — ABNORMAL HIGH (ref 0–149)
VLDL Cholesterol Cal: 27 mg/dL (ref 5–40)

## 2023-08-23 LAB — PSA: Prostate Specific Ag, Serum: 4.5 ng/mL — ABNORMAL HIGH (ref 0.0–4.0)

## 2023-08-23 NOTE — Progress Notes (Signed)
 Hi Jim, prostate specific test is slightly elevated at 4.5.  When she gets started on your medications I want to recheck your PSA in 3 months and see if it trends back down if it does that is fantastic.  If it stays a little elevated or continues to increase then we will refer you to urology.  Total cholesterol and LDL are elevated.  Just encourage you to continue to work on healthy diet regular exercise, and some weight loss.  Your iron levels are technically normal but they are on the low end of normal.  So just encourage you to work on eating iron rich foods.  Green leafy's can be a great source.  Your kidney function looks great this time it has been a little elevated over the last year and it is back down to 1.1 which is absolutely fantastic.  Liver function looks great.  Blood counts normal.  Magnesium and muscle enzyme level is also normal.  So unfortunately nothing very specific to explain the muscle cramping.  I know you have really worked hard on staying hydrated.  Just make sure you are also wearing good foot support and doing lots of stretching.

## 2023-08-24 ENCOUNTER — Other Ambulatory Visit: Payer: Self-pay | Admitting: Family Medicine

## 2023-08-24 DIAGNOSIS — T464X5A Adverse effect of angiotensin-converting-enzyme inhibitors, initial encounter: Secondary | ICD-10-CM

## 2023-08-24 DIAGNOSIS — R058 Other specified cough: Secondary | ICD-10-CM

## 2023-08-24 NOTE — Assessment & Plan Note (Signed)
 Also completed ED questionnaire today with score of 13.  Testosterone  questionnaire score of 2.  Okay to continue tadalafil  as needed.

## 2023-10-09 DIAGNOSIS — I8311 Varicose veins of right lower extremity with inflammation: Secondary | ICD-10-CM | POA: Diagnosis not present

## 2023-10-09 DIAGNOSIS — I83893 Varicose veins of bilateral lower extremities with other complications: Secondary | ICD-10-CM | POA: Diagnosis not present

## 2023-10-09 DIAGNOSIS — I8312 Varicose veins of left lower extremity with inflammation: Secondary | ICD-10-CM | POA: Diagnosis not present

## 2023-10-09 DIAGNOSIS — R252 Cramp and spasm: Secondary | ICD-10-CM | POA: Diagnosis not present

## 2023-10-09 DIAGNOSIS — R600 Localized edema: Secondary | ICD-10-CM | POA: Diagnosis not present

## 2023-10-25 ENCOUNTER — Ambulatory Visit (INDEPENDENT_AMBULATORY_CARE_PROVIDER_SITE_OTHER): Admitting: Family Medicine

## 2023-10-25 VITALS — BP 134/82 | HR 57 | Ht 70.0 in | Wt 231.1 lb

## 2023-10-25 DIAGNOSIS — N5089 Other specified disorders of the male genital organs: Secondary | ICD-10-CM | POA: Diagnosis not present

## 2023-10-25 DIAGNOSIS — R972 Elevated prostate specific antigen [PSA]: Secondary | ICD-10-CM

## 2023-10-25 DIAGNOSIS — S20462A Insect bite (nonvenomous) of left back wall of thorax, initial encounter: Secondary | ICD-10-CM

## 2023-10-25 DIAGNOSIS — W57XXXA Bitten or stung by nonvenomous insect and other nonvenomous arthropods, initial encounter: Secondary | ICD-10-CM | POA: Diagnosis not present

## 2023-10-25 NOTE — Patient Instructions (Signed)
 Due to recheck PSA at end of July.

## 2023-10-25 NOTE — Progress Notes (Signed)
   Established Patient Office Visit  Subjective  Patient ID: Steven Ashley, male    DOB: Feb 03, 1951  Age: 73 y.o. MRN: [id number]  No chief complaint on file.   HPI He feels like his vitiligo is getting worse.  So he is planning on talking with his dermatologist about it.  He is currently using up Opzelura.  has been under some increased stress recently and feels like that could be contributing.  He is here today because he actually experienced a tick bite a couple of weeks ago on his left flank.  He tried his best to remove it but has noticed he still has a red knot he said he felt a little tired later that day but no other neurologic symptoms he never noticed any discrete rashes.  When he was checking his body to look for additional ticks he felt a lump on his left testicle he says it was not painful it was probably the size of a pinto bean.  In the last couple of days he actually has not been able to feel it so he thinks it may have gradually gone away.  No rashes.     ROS    Objective:     BP 134/82   Pulse (!) 57   Ht 5' 10 (1.778 m)   Wt 231 lb 1.9 oz (104.8 kg)   SpO2 97%   BMI 33.16 kg/m    Physical Exam Genitourinary:    Comments: I do not palpate a discrete lesion on the left testicle or on the skin. Skin:    Comments: Thematous papule on the left flank-appears to be healing well.  No active drainage.  No foreign body present.  No diffuse rash.      No results found for any visits on 10/25/23.    The 10-year ASCVD risk score (Arnett DK, et al., 2019) is: 30.9%    Assessment & Plan:   Problem List Items Addressed This Visit   None Visit Diagnoses       Testicle lump    -  Primary   Relevant Orders   US  SCROTUM W/DOPPLER     Elevated PSA       Relevant Orders   PSA     Tick bite of left back wall of thorax, initial encounter           Tick bite -still has an erythematous papule but no rash.  Did examine the lesion.  Looks like it is healing  and will likely take another couple of weeks to go away avoid irritation and scratching and rubbing at the area.  Elevated PSA - reminded him due for recheck at the end of the month.   Left-sided testicular lump-I did not palpate a discrete lump on exam today but I would like to get him scheduled for an ultrasound for further workup.  No follow-ups on file.    Dorothyann Byars, MD

## 2023-10-26 ENCOUNTER — Other Ambulatory Visit

## 2023-10-26 DIAGNOSIS — N5089 Other specified disorders of the male genital organs: Secondary | ICD-10-CM

## 2023-10-27 LAB — PSA: Prostate Specific Ag, Serum: 2.2 ng/mL (ref 0.0–4.0)

## 2023-10-28 ENCOUNTER — Ambulatory Visit: Payer: Self-pay | Admitting: Family Medicine

## 2023-10-28 NOTE — Progress Notes (Signed)
 Number back down.  Looks great!!

## 2023-10-29 NOTE — Progress Notes (Signed)
 Hi Jim, they did notice 2 spots 1 on the right measuring about 9 mm and the other 1 on the left measuring about 9 mm as well that they said could be an epididymal lipoma which is like a little fat growth.  They are not harmful.  But they could also said that it could be a little scar tissue which is like a granuloma.  Did not see any testicular mass or cancer which is great.  On the left side they did see a little hydrocele which is just a little pocket of fluid.  I am wondering if that might have even been the area that you were feeling.  So at this point nothing to worry about if you feel a lump again then it may be worth getting you in with a urologist which is a specialist in this area.

## 2023-11-06 DIAGNOSIS — L218 Other seborrheic dermatitis: Secondary | ICD-10-CM | POA: Diagnosis not present

## 2023-11-06 DIAGNOSIS — L821 Other seborrheic keratosis: Secondary | ICD-10-CM | POA: Diagnosis not present

## 2023-11-06 DIAGNOSIS — L814 Other melanin hyperpigmentation: Secondary | ICD-10-CM | POA: Diagnosis not present

## 2023-11-06 DIAGNOSIS — S50812A Abrasion of left forearm, initial encounter: Secondary | ICD-10-CM | POA: Diagnosis not present

## 2023-11-06 DIAGNOSIS — R209 Unspecified disturbances of skin sensation: Secondary | ICD-10-CM | POA: Diagnosis not present

## 2023-11-06 DIAGNOSIS — L820 Inflamed seborrheic keratosis: Secondary | ICD-10-CM | POA: Diagnosis not present

## 2023-11-06 DIAGNOSIS — D235 Other benign neoplasm of skin of trunk: Secondary | ICD-10-CM | POA: Diagnosis not present

## 2023-11-06 DIAGNOSIS — L2989 Other pruritus: Secondary | ICD-10-CM | POA: Diagnosis not present

## 2023-11-06 DIAGNOSIS — L80 Vitiligo: Secondary | ICD-10-CM | POA: Diagnosis not present

## 2024-01-10 DIAGNOSIS — N1831 Chronic kidney disease, stage 3a: Secondary | ICD-10-CM | POA: Diagnosis not present

## 2024-01-10 DIAGNOSIS — I10 Essential (primary) hypertension: Secondary | ICD-10-CM | POA: Diagnosis not present

## 2024-01-10 DIAGNOSIS — R809 Proteinuria, unspecified: Secondary | ICD-10-CM | POA: Diagnosis not present

## 2024-01-10 DIAGNOSIS — N182 Chronic kidney disease, stage 2 (mild): Secondary | ICD-10-CM | POA: Diagnosis not present

## 2024-01-10 DIAGNOSIS — E785 Hyperlipidemia, unspecified: Secondary | ICD-10-CM | POA: Diagnosis not present

## 2024-03-04 ENCOUNTER — Ambulatory Visit: Payer: Self-pay

## 2024-03-04 ENCOUNTER — Encounter: Payer: Self-pay | Admitting: Urgent Care

## 2024-03-04 ENCOUNTER — Ambulatory Visit (INDEPENDENT_AMBULATORY_CARE_PROVIDER_SITE_OTHER): Admitting: Urgent Care

## 2024-03-04 VITALS — BP 205/79 | HR 61 | Ht 70.0 in | Wt 234.0 lb

## 2024-03-04 DIAGNOSIS — H01001 Unspecified blepharitis right upper eyelid: Secondary | ICD-10-CM

## 2024-03-04 DIAGNOSIS — I10 Essential (primary) hypertension: Secondary | ICD-10-CM | POA: Diagnosis not present

## 2024-03-04 DIAGNOSIS — Z23 Encounter for immunization: Secondary | ICD-10-CM | POA: Diagnosis not present

## 2024-03-04 DIAGNOSIS — F430 Acute stress reaction: Secondary | ICD-10-CM | POA: Diagnosis not present

## 2024-03-04 DIAGNOSIS — Z6833 Body mass index (BMI) 33.0-33.9, adult: Secondary | ICD-10-CM

## 2024-03-04 MED ORDER — ERYTHROMYCIN 5 MG/GM OP OINT: TOPICAL_OINTMENT | OPHTHALMIC | 0 refills | Status: AC

## 2024-03-04 NOTE — Progress Notes (Unsigned)
 Established Patient Office Visit  Subjective:  Patient ID: Steven Ashley, male    DOB: 1950/12/04  Age: 73 y.o. MRN: [id number]  Chief Complaint  Patient presents with   Eye Problem    Right eye x 1 week, red, cloudy drainage, vision fuzzy    HPI  Discussed the use of AI scribe software for clinical note transcription with the patient, who gave verbal consent to proceed.  History of Present Illness   Steven Ashley is a 73 year old male with hypertension who presents with right eye discharge and vision changes.  For the past two weeks, he has experienced a thin white-grayish discharge from his right eye, particularly noticeable upon waking. He sleeps on his right side and feels as though something is under his eyelid. Systane eye drops provide some relief. He also reports pressure and redness in the right eye, extending from the center to the lateral side. Symptoms improve temporarily but recur daily, with no overall improvement over the two-week period.  His vision is usually good, and he does not wear glasses. However, he has noticed blurriness in his right eye, affecting his ability to read, while his left eye remains unaffected. No flashes of light, headaches, congestion, or sore throat.  He mentions increased stress recently due to his wife's ER visit, a death in the family, and a sick son. His blood pressure, usually well-controlled on losartan  5 mg, has been elevated recently, with a reading of 218/100 mmHg. No dizziness, numbness, or tingling.  He has a history of hypertension and is currently taking losartan  5 mg for management. He has been monitoring his blood pressure, which has been normal until the recent elevation.       Patient Active Problem List   Diagnosis Date Noted   Rhus dermatitis 08/02/2023   Rash and nonspecific skin eruption 12/30/2021   Chronic right-sided low back pain without sciatica 12/30/2021   Vitiligo 10/30/2015   ED (erectile dysfunction)  07/30/2012   BPH (benign prostatic hyperplasia) 03/02/2011   Past Medical History:  Diagnosis Date   Arthritis    Cataract    Chronic kidney disease 1984   passed kidney stone   Glaucoma    Past Surgical History:  Procedure Laterality Date   CERVICAL DISC SURGERY  04/26/2003   EYE SURGERY     SPINE SURGERY  2006   disc replacement 3-4 vert neck   Social History   Tobacco Use   Smoking status: Never   Smokeless tobacco: Never  Vaping Use   Vaping status: Never Used  Substance Use Topics   Alcohol use: Yes    Alcohol/week: 0.0 standard drinks of alcohol    Comment: occasional drink   Drug use: No      ROS: as noted in HPI  Objective:     BP (!) 205/79   Pulse 61   Ht 5' 10 (1.778 m)   Wt 234 lb (106.1 kg)   SpO2 95%   BMI 33.58 kg/m  BP Readings from Last 3 Encounters:  03/04/24 (!) 205/79  10/25/23 134/82  08/22/23 130/82   Wt Readings from Last 3 Encounters:  03/04/24 234 lb (106.1 kg)  10/25/23 231 lb 1.9 oz (104.8 kg)  08/22/23 227 lb (103 kg)      Physical Exam     No results found for any visits on 03/04/24.  Last CBC Lab Results  Component Value Date   WBC 6.5 08/22/2023   HGB  13.9 08/22/2023   HCT 41.5 08/22/2023   MCV 92 08/22/2023   MCH 30.8 08/22/2023   RDW 13.2 08/22/2023   PLT 166 08/22/2023   Last metabolic panel Lab Results  Component Value Date   GLUCOSE 83 08/22/2023   NA 140 08/22/2023   K 4.9 08/22/2023   CL 104 08/22/2023   CO2 24 08/22/2023   BUN 18 08/22/2023   CREATININE 1.13 08/22/2023   EGFR 68 08/22/2023   CALCIUM 9.4 08/22/2023   PROT 6.4 08/22/2023   ALBUMIN 4.4 08/22/2023   LABGLOB 2.0 08/22/2023   BILITOT 0.2 08/22/2023   ALKPHOS 57 08/22/2023   AST 23 08/22/2023   ALT 28 08/22/2023   Last lipids Lab Results  Component Value Date   CHOL 229 (H) 08/22/2023   HDL 54 08/22/2023   LDLCALC 148 (H) 08/22/2023   LDLDIRECT 102 (H) 03/12/2010   TRIG 152 (H) 08/22/2023   CHOLHDL 4.8 12/30/2021    Last hemoglobin A1c No results found for: HGBA1C Last thyroid  functions Lab Results  Component Value Date   TSH 2.55 05/10/2017   Last vitamin D No results found for: 25OHVITD2, 25OHVITD3, VD25OH Last vitamin B12 and Folate No results found for: VITAMINB12, FOLATE    The ASCVD Risk score (Arnett DK, et al., 2019) failed to calculate for the following reasons:   The valid systolic blood pressure range is 90 to 200 mmHg  Assessment & Plan:  Blepharitis of right upper eyelid, unspecified type -     Erythromycin; Apply 1 inch ribbon to affected eye TID for 5 days.  Dispense: 3.5 g; Refill: 0  Immunization due -     Flu vaccine HIGH DOSE PF(Fluzone Trivalent)     Return in about 1 week (around 03/11/2024).   Benton LITTIE Gave, PA

## 2024-03-04 NOTE — Progress Notes (Unsigned)
 SABRA

## 2024-03-04 NOTE — Telephone Encounter (Signed)
 FYI Only or Action Required?: FYI only for provider: appointment scheduled on 03/04/2024.  Patient was last seen in primary care on 10/25/2023 by Alvan Dorothyann BIRCH, MD.  Called Nurse Triage reporting Eye Problem.  Symptoms began several days ago.  Interventions attempted: Other: Eye drops.  Symptoms are: gradually worsening.  Triage Disposition: See HCP Within 4 Hours (Or PCP Triage)  Patient/caregiver understands and will follow disposition?: Yes              Copied from CRM [id number]. Topic: Clinical - Red Word Triage >> Mar 04, 2024  9:07 AM Donna BRAVO wrote: Red Word that prompted transfer to Nurse Triage: patient calling  Symptoms: -eye doctor wont see him  -no pain  -been going on a week -right is very blury lasting all day -waking up the right eye is clowdy -sometimes has thick fluit in bottom of eye -feels like something in eye like a hair / nothing sharp -the liquid is getting thicker -left eye is good -some itching -no burning -little red and discolored Reason for Disposition  Blurred vision  Answer Assessment - Initial Assessment Questions 1. EYE DISCHARGE: Is the discharge in one or both eyes? What color is it? How much is there? When did the discharge start?      White milky fluid  2. REDNESS OF SCLERA: Is there redness in the white of the eye? If Yes, ask: Is it in one or both eyes? When did the redness start?     Some mild redness  3. EYELIDS: Are the eyelids red or swollen? If Yes, ask: How much?      Denies  4. VISION: Do you have blurred vision?     Yes  5. PAIN: Is there any pain? If Yes, ask: How bad is the pain? (Scale 0-10; or none, mild, moderate, severe)     None  6. CONTACT LENS: Do you wear contacts?     Denies  7. OTHER SYMPTOMS: Do you have any other symptoms? (e.g., fever, runny nose, cough)     Denies   Used eye drops for symptoms. He states he feels some thickness underneath his eye.He also states  he has some mild itching and feels like something is in his eye.  Protocols used: Eye - Pus or Discharge-A-AH

## 2024-03-04 NOTE — Patient Instructions (Signed)
 I have prescribed erythromycin eye ointment. Use this on your right eyes three times daily x 5 days. If the ointment is too hard to administer, hold it in your warm hands for 5 minutes prior and it will act more like a drop. Warm moist washcloths with Vicci and Johnson baby shampoo can be used to gently massage and cleans to eyes.   START TAKING YOUR LOSARTAN  FIRST THING IN THE MORNING Monitor BP at home - goal 120/80. If remains elevated, RTC sooner. Please go home and take BP med today!!

## 2024-03-11 ENCOUNTER — Encounter: Payer: Self-pay | Admitting: Urgent Care

## 2024-03-11 ENCOUNTER — Ambulatory Visit (INDEPENDENT_AMBULATORY_CARE_PROVIDER_SITE_OTHER): Admitting: Urgent Care

## 2024-03-11 VITALS — BP 130/76 | HR 57 | Ht 70.0 in | Wt 230.0 lb

## 2024-03-11 DIAGNOSIS — H02056 Trichiasis without entropian left eye, unspecified eyelid: Secondary | ICD-10-CM

## 2024-03-11 DIAGNOSIS — I10 Essential (primary) hypertension: Secondary | ICD-10-CM | POA: Diagnosis not present

## 2024-03-11 DIAGNOSIS — H02053 Trichiasis without entropian right eye, unspecified eyelid: Secondary | ICD-10-CM | POA: Diagnosis not present

## 2024-03-11 DIAGNOSIS — H01001 Unspecified blepharitis right upper eyelid: Secondary | ICD-10-CM | POA: Diagnosis not present

## 2024-03-11 NOTE — Progress Notes (Signed)
 Established Patient Office Visit  Subjective:  Patient ID: Steven Ashley, male    DOB: 03/05/1951  Age: 73 y.o. MRN: [id number]  Chief Complaint  Patient presents with   Follow-up    HPI  Discussed the use of AI scribe software for clinical note transcription with the patient, who gave verbal consent to proceed.  History of Present Illness   Steven Ashley is a 73 year old male who presents with elevated blood pressure readings and eye irritation.  He has been monitoring his blood pressure daily at home, with readings typically around 135/57 to 132/58. However, he experienced a concerning reading of 200, last week, prompting today's follow up visit. He recently switched his medication intake from night to morning, which may have affected his blood pressure control. He has a history of white coat hypertension, which may contribute to elevated readings in clinical settings.  He has been experiencing eye irritation, which he attributes to blepharitis. He has been using erythromycin ointment three times a day for five days, noting some reduction in redness but persistent discomfort. He describes a sensation of 'something in the eye' and attributes some irritation to his eyelashes turning inward, a condition he manages by removing eyelashes with tweezers. He also mentions exposure to yard work chemicals, which could potentially irritate his eyes.  He switched from lisinopril  to losartan  due to fatigue and low energy levels with the former medication. He reports improved energy levels with losartan , allowing him to engage in daily activities without needing naps.  He is a retired emergency planning/management officer, accustomed to managing stress. He engages in yard work and does not typically wear protective eyewear.  He denies feeling unwell and reports having plenty of energy.      Patient Active Problem List   Diagnosis Date Noted   Rhus dermatitis 08/02/2023   Rash and nonspecific skin eruption  12/30/2021   Chronic right-sided low back pain without sciatica 12/30/2021   Vitiligo 10/30/2015   ED (erectile dysfunction) 07/30/2012   BPH (benign prostatic hyperplasia) 03/02/2011   Past Medical History:  Diagnosis Date   Arthritis    Cataract    Chronic kidney disease 1984   passed kidney stone   Glaucoma    Past Surgical History:  Procedure Laterality Date   CERVICAL DISC SURGERY  04/26/2003   EYE SURGERY     SPINE SURGERY  2006   disc replacement 3-4 vert neck   Social History   Tobacco Use   Smoking status: Never   Smokeless tobacco: Never  Vaping Use   Vaping status: Never Used  Substance Use Topics   Alcohol use: Yes    Alcohol/week: 0.0 standard drinks of alcohol    Comment: occasional drink   Drug use: No      ROS: as noted in HPI  Objective:     BP 130/76   Pulse (!) 57   Ht 5' 10 (1.778 m)   Wt 230 lb (104.3 kg)   SpO2 98%   BMI 33.00 kg/m  BP Readings from Last 3 Encounters:  03/11/24 130/76  03/04/24 (!) 205/79  10/25/23 134/82   Wt Readings from Last 3 Encounters:  03/11/24 230 lb (104.3 kg)  03/04/24 234 lb (106.1 kg)  10/25/23 231 lb 1.9 oz (104.8 kg)      Physical Exam Vitals and nursing note reviewed.  Constitutional:      General: He is not in acute distress.    Appearance: Normal appearance.  He is not ill-appearing, toxic-appearing or diaphoretic.  HENT:     Head: Normocephalic and atraumatic.     Right Ear: External ear normal.     Left Ear: External ear normal.     Nose: Nose normal.  Eyes:     General: No scleral icterus.       Right eye: No discharge.        Left eye: No discharge.     Extraocular Movements: Extraocular movements intact.     Conjunctiva/sclera: Conjunctivae normal.     Pupils: Pupils are equal, round, and reactive to light.  Cardiovascular:     Rate and Rhythm: Normal rate and regular rhythm.     Heart sounds: No murmur heard. Pulmonary:     Effort: Pulmonary effort is normal. No respiratory  distress.     Breath sounds: Normal breath sounds. No stridor.  Lymphadenopathy:     Cervical: No cervical adenopathy.  Skin:    General: Skin is warm and dry.     Findings: No erythema or rash.  Neurological:     General: No focal deficit present.     Mental Status: He is alert and oriented to person, place, and time.      No results found for any visits on 03/11/24.  Last CBC Lab Results  Component Value Date   WBC 6.5 08/22/2023   HGB 13.9 08/22/2023   HCT 41.5 08/22/2023   MCV 92 08/22/2023   MCH 30.8 08/22/2023   RDW 13.2 08/22/2023   PLT 166 08/22/2023   Last metabolic panel Lab Results  Component Value Date   GLUCOSE 83 08/22/2023   NA 140 08/22/2023   K 4.9 08/22/2023   CL 104 08/22/2023   CO2 24 08/22/2023   BUN 18 08/22/2023   CREATININE 1.13 08/22/2023   EGFR 68 08/22/2023   CALCIUM 9.4 08/22/2023   PROT 6.4 08/22/2023   ALBUMIN 4.4 08/22/2023   LABGLOB 2.0 08/22/2023   BILITOT 0.2 08/22/2023   ALKPHOS 57 08/22/2023   AST 23 08/22/2023   ALT 28 08/22/2023      The 10-year ASCVD risk score (Arnett DK, et al., 2019) is: 29.6%  Assessment & Plan:  Blepharitis of right upper eyelid, unspecified type  Hypertension, unspecified type  Trichiasis of eyelid of both eyes  Assessment and Plan    Hypertension Well-controlled with losartan . Home and office readings acceptable. - Continue losartan .  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 Erythromycin ointment providing some improvement. Discussed potential chemical contamination from yard work. - Finish erythromycin ointment course. - Consider vigamox eye drops if symptoms persist. - Follow up with eye doctor if no improvement. - Provided blepharitis management handout.         No follow-ups on file.   Benton LITTIE Gave, PA

## 2024-04-12 ENCOUNTER — Ambulatory Visit

## 2024-04-12 ENCOUNTER — Encounter: Payer: Self-pay | Admitting: Urgent Care

## 2024-04-12 ENCOUNTER — Ambulatory Visit: Admitting: Urgent Care

## 2024-04-12 ENCOUNTER — Ambulatory Visit: Payer: Self-pay | Admitting: Urgent Care

## 2024-04-12 VITALS — BP 136/78 | HR 64 | Temp 98.7°F | Ht 70.0 in | Wt 231.0 lb

## 2024-04-12 DIAGNOSIS — R051 Acute cough: Secondary | ICD-10-CM

## 2024-04-12 DIAGNOSIS — R0989 Other specified symptoms and signs involving the circulatory and respiratory systems: Secondary | ICD-10-CM

## 2024-04-12 DIAGNOSIS — J22 Unspecified acute lower respiratory infection: Secondary | ICD-10-CM

## 2024-04-12 DIAGNOSIS — R9389 Abnormal findings on diagnostic imaging of other specified body structures: Secondary | ICD-10-CM

## 2024-04-12 LAB — POC SOFIA 2 FLU + SARS ANTIGEN FIA
Influenza A, POC: NEGATIVE
Influenza B, POC: NEGATIVE
SARS Coronavirus 2 Ag: NEGATIVE

## 2024-04-12 MED ORDER — HYDROCOD POLI-CHLORPHE POLI ER 10-8 MG/5ML PO SUER: 5.0000 mL | Freq: Two times a day (BID) | ORAL | 0 refills | Status: DC | PRN

## 2024-04-12 MED ORDER — AZITHROMYCIN 250 MG PO TABS: ORAL_TABLET | ORAL | 0 refills | Status: AC

## 2024-04-12 MED ORDER — HYDROCOD POLI-CHLORPHE POLI ER 10-8 MG/5ML PO SUER: 5.0000 mL | Freq: Two times a day (BID) | ORAL | 0 refills | Status: AC | PRN

## 2024-04-12 NOTE — Progress Notes (Signed)
 Pt called stating [facility] does not have the cough syrup. Requested it be sent to [facility] in [place] instead.

## 2024-04-12 NOTE — Patient Instructions (Signed)
 Please go to [address] for a stat chest xray.  Start mucinex over the counter with plenty of water.  Take 5mL of the cough suppressant at night time. Caution as it can make you drowsy.

## 2024-04-12 NOTE — Progress Notes (Signed)
 "  Established Patient Office Visit  Subjective:  Patient ID: Steven Ashley, male    DOB: 28-Feb-1951  Age: 73 y.o. MRN: [id number]  Chief Complaint  Patient presents with   Cough    Cough    Discussed the use of AI scribe software for clinical note transcription with the patient, who gave verbal consent to proceed.  History of Present Illness   Steven Ashley is a 73 year old male who presents with symptoms of a severe cold.  He began experiencing symptoms consistent with a severe cold on Monday, including a fever that reached up to 99.44F, although his baseline temperature is typically below 98.44F. His temperature has since returned to around 42F.  He has significant congestion with yellow, thick mucus affecting his lungs and nasal passages. He experiences difficulty speaking and a persistent cough that worsens when lying down, leading to disrupted sleep. Despite using over-the-counter medications such as Advil , Advil  Cold & Sinus, and a [facility] brand night-time cold medication, he continues to feel exhausted and is not sleeping well.  He describes feeling cold constantly, even in environments where others are comfortable. He denies chills and hot flashes, stating he only feels cold.  He has a history of pneumonia from childhood, which required a week in an oxygen tent. He received a flu shot in September.  He has an allergy to tetracycline and is uncertain about doxycycline, noting that he has not been asked about it before.  He and his family try to keep a low profile when he is unwell to avoid spreading illness.      Patient Active Problem List   Diagnosis Date Noted   Rhus dermatitis 08/02/2023   Rash and nonspecific skin eruption 12/30/2021   Chronic right-sided low back pain without sciatica 12/30/2021   Vitiligo 10/30/2015   ED (erectile dysfunction) 07/30/2012   BPH (benign prostatic hyperplasia) 03/02/2011   Past Medical History:  Diagnosis Date   Arthritis     Cataract    Chronic kidney disease 1984   passed kidney stone   Glaucoma    Past Surgical History:  Procedure Laterality Date   CERVICAL DISC SURGERY  04/26/2003   EYE SURGERY     SPINE SURGERY  2006   disc replacement 3-4 vert neck   Social History[1]    ROS: as noted in HPI  Objective:     BP 136/78   Pulse 64   Temp 98.7 F (37.1 C) (Oral)   Ht 5' 10 (1.778 m)   Wt 231 lb (104.8 kg)   SpO2 99%   BMI 33.15 kg/m  BP Readings from Last 3 Encounters:  04/12/24 136/78  03/11/24 130/76  03/04/24 (!) 205/79   Wt Readings from Last 3 Encounters:  04/12/24 231 lb (104.8 kg)  03/11/24 230 lb (104.3 kg)  03/04/24 234 lb (106.1 kg)      Physical Exam Vitals and nursing note reviewed. Exam conducted with a chaperone present.  Constitutional:      General: He is not in acute distress.    Appearance: Normal appearance. He is not ill-appearing, toxic-appearing or diaphoretic.  HENT:     Head: Normocephalic and atraumatic.     Right Ear: Tympanic membrane, ear canal and external ear normal. There is no impacted cerumen.     Left Ear: Tympanic membrane, ear canal and external ear normal. There is no impacted cerumen.     Nose: Nose normal. No congestion or rhinorrhea.  Mouth/Throat:     Mouth: Mucous membranes are moist.     Pharynx: Oropharynx is clear. No oropharyngeal exudate or posterior oropharyngeal erythema.  Eyes:     General: No scleral icterus.       Right eye: No discharge.        Left eye: No discharge.     Extraocular Movements: Extraocular movements intact.     Pupils: Pupils are equal, round, and reactive to light.  Cardiovascular:     Rate and Rhythm: Normal rate and regular rhythm.     Pulses: Normal pulses.     Heart sounds: Normal heart sounds.  Pulmonary:     Effort: Pulmonary effort is normal. No respiratory distress.     Breath sounds: Rhonchi (with crackles to RLL posterior lung fields) present. No wheezing.  Musculoskeletal:      Cervical back: Normal range of motion and neck supple. No rigidity or tenderness.  Lymphadenopathy:     Cervical: No cervical adenopathy.  Neurological:     Mental Status: He is alert.      Results for orders placed or performed in visit on 04/12/24  POC SOFIA 2 FLU + SARS ANTIGEN FIA  Result Value Ref Range   Influenza A, POC Negative Negative   Influenza B, POC Negative Negative   SARS Coronavirus 2 Ag Negative Negative    Last CBC Lab Results  Component Value Date   WBC 6.5 08/22/2023   HGB 13.9 08/22/2023   HCT 41.5 08/22/2023   MCV 92 08/22/2023   MCH 30.8 08/22/2023   RDW 13.2 08/22/2023   PLT 166 08/22/2023   Last metabolic panel Lab Results  Component Value Date   GLUCOSE 83 08/22/2023   NA 140 08/22/2023   K 4.9 08/22/2023   CL 104 08/22/2023   CO2 24 08/22/2023   BUN 18 08/22/2023   CREATININE 1.13 08/22/2023   EGFR 68 08/22/2023   CALCIUM 9.4 08/22/2023   PROT 6.4 08/22/2023   ALBUMIN 4.4 08/22/2023   LABGLOB 2.0 08/22/2023   BILITOT 0.2 08/22/2023   ALKPHOS 57 08/22/2023   AST 23 08/22/2023   ALT 28 08/22/2023      The 10-year ASCVD risk score (Arnett DK, et al., 2019) is: 31.6%  Assessment & Plan:  Acute cough -     POC SOFIA 2 FLU + SARS ANTIGEN FIA -     DG Chest 2 View; Future -     Hydrocod Poli-Chlorphe Poli ER; Take 5 mLs by mouth every 12 (twelve) hours as needed for cough (cough, will cause drowsiness.).  Dispense: 70 mL; Refill: 0  Chest crackles -     DG Chest 2 View; Future -     Hydrocod Poli-Chlorphe Poli ER; Take 5 mLs by mouth every 12 (twelve) hours as needed for cough (cough, will cause drowsiness.).  Dispense: 70 mL; Refill: 0  Assessment and Plan    Suspected pneumonia Symptoms and crackles in right lower lobe suggest pneumonia. COVID-19 negative. History of pneumonia. Possible tetracycline allergy. - Ordered chest x-ray to guide antibiotic treatment.  - start Q HS cough medication - continue mucinex during the day to  help aid in mucous expectoration.        No follow-ups on file.   Benton LITTIE Gave, PA    [1]  Social History Tobacco Use   Smoking status: Never   Smokeless tobacco: Never  Vaping Use   Vaping status: Never Used  Substance Use Topics   Alcohol use: Yes  Alcohol/week: 0.0 standard drinks of alcohol    Comment: occasional drink   Drug use: No   "

## 2024-05-13 ENCOUNTER — Ambulatory Visit

## 2024-05-13 DIAGNOSIS — R918 Other nonspecific abnormal finding of lung field: Secondary | ICD-10-CM | POA: Diagnosis not present

## 2024-05-13 DIAGNOSIS — R9389 Abnormal findings on diagnostic imaging of other specified body structures: Secondary | ICD-10-CM

## 2024-05-13 DIAGNOSIS — J22 Unspecified acute lower respiratory infection: Secondary | ICD-10-CM

## 2024-05-17 ENCOUNTER — Ambulatory Visit: Payer: Self-pay | Admitting: Urgent Care

## 2024-05-26 ENCOUNTER — Other Ambulatory Visit: Payer: Self-pay | Admitting: Family Medicine

## 2024-06-10 ENCOUNTER — Ambulatory Visit: Payer: Medicare Other

## 2024-06-13 ENCOUNTER — Ambulatory Visit
